# Patient Record
Sex: Male | Born: 1965 | Race: Black or African American | Hispanic: No | Marital: Single | Smoking: Never smoker
Health system: Southern US, Community
[De-identification: ages and names within clinical notes are randomized; demographics above are authoritative.]

## PROBLEM LIST (undated history)

## (undated) DIAGNOSIS — J449 Chronic obstructive pulmonary disease, unspecified: Secondary | ICD-10-CM

## (undated) DIAGNOSIS — I879 Disorder of vein, unspecified: Secondary | ICD-10-CM

## (undated) HISTORY — PX: KNEE ARTHROSCOPY: SUR90

---

## 2000-02-27 ENCOUNTER — Encounter: Admission: RE | Admit: 2000-02-27 | Discharge: 2000-02-27 | Payer: Self-pay | Admitting: Internal Medicine

## 2000-02-27 ENCOUNTER — Encounter: Payer: Self-pay | Admitting: Internal Medicine

## 2002-12-10 ENCOUNTER — Emergency Department (HOSPITAL_COMMUNITY): Admission: EM | Admit: 2002-12-10 | Discharge: 2002-12-10 | Payer: Self-pay | Admitting: Emergency Medicine

## 2002-12-10 ENCOUNTER — Encounter: Payer: Self-pay | Admitting: Emergency Medicine

## 2003-09-25 ENCOUNTER — Encounter: Admission: RE | Admit: 2003-09-25 | Discharge: 2003-09-25 | Payer: Self-pay | Admitting: Neurosurgery

## 2003-10-10 ENCOUNTER — Encounter: Admission: RE | Admit: 2003-10-10 | Discharge: 2003-10-10 | Payer: Self-pay | Admitting: Neurosurgery

## 2003-10-25 ENCOUNTER — Encounter: Admission: RE | Admit: 2003-10-25 | Discharge: 2003-10-25 | Payer: Self-pay | Admitting: Neurosurgery

## 2009-10-19 DIAGNOSIS — I879 Disorder of vein, unspecified: Secondary | ICD-10-CM

## 2009-10-19 HISTORY — DX: Disorder of vein, unspecified: I87.9

## 2010-08-22 ENCOUNTER — Emergency Department (HOSPITAL_COMMUNITY): Admission: EM | Admit: 2010-08-22 | Discharge: 2010-08-22 | Payer: Self-pay | Admitting: Emergency Medicine

## 2010-12-30 LAB — POCT I-STAT, CHEM 8
Calcium, Ion: 1.18 mmol/L (ref 1.12–1.32)
Chloride: 100 mEq/L (ref 96–112)
Glucose, Bld: 86 mg/dL (ref 70–99)
HCT: 49 % (ref 39.0–52.0)
Hemoglobin: 16.7 g/dL (ref 13.0–17.0)

## 2010-12-30 LAB — CBC
MCH: 29.4 pg (ref 26.0–34.0)
MCHC: 33.7 g/dL (ref 30.0–36.0)
RDW: 13.8 % (ref 11.5–15.5)

## 2010-12-30 LAB — DIFFERENTIAL
Basophils Absolute: 0 10*3/uL (ref 0.0–0.1)
Basophils Relative: 1 % (ref 0–1)
Eosinophils Absolute: 0.1 10*3/uL (ref 0.0–0.7)
Monocytes Absolute: 0.8 10*3/uL (ref 0.1–1.0)
Neutro Abs: 5.6 10*3/uL (ref 1.7–7.7)
Neutrophils Relative %: 68 % (ref 43–77)

## 2012-05-27 ENCOUNTER — Ambulatory Visit: Payer: Self-pay

## 2012-05-27 ENCOUNTER — Other Ambulatory Visit: Payer: Self-pay | Admitting: Occupational Medicine

## 2012-05-27 DIAGNOSIS — Z021 Encounter for pre-employment examination: Secondary | ICD-10-CM

## 2014-03-19 ENCOUNTER — Observation Stay (HOSPITAL_COMMUNITY)
Admission: AD | Admit: 2014-03-19 | Discharge: 2014-03-20 | Disposition: A | Discharge status: Home / Self Care | Payer: BC Managed Care – PPO | Source: Ambulatory Visit | Attending: Surgery | Admitting: Surgery

## 2014-03-19 ENCOUNTER — Encounter (HOSPITAL_COMMUNITY): Payer: Self-pay | Admitting: *Deleted

## 2014-03-19 ENCOUNTER — Encounter (HOSPITAL_COMMUNITY): Payer: BC Managed Care – PPO | Admitting: Certified Registered Nurse Anesthetist

## 2014-03-19 ENCOUNTER — Ambulatory Visit (INDEPENDENT_AMBULATORY_CARE_PROVIDER_SITE_OTHER): Payer: BC Managed Care – PPO | Admitting: General Surgery

## 2014-03-19 ENCOUNTER — Ambulatory Visit: Admission: AD | Admit: 2014-03-19 | Payer: BC Managed Care – PPO | Source: Ambulatory Visit | Admitting: General Surgery

## 2014-03-19 ENCOUNTER — Encounter (INDEPENDENT_AMBULATORY_CARE_PROVIDER_SITE_OTHER): Payer: Self-pay | Admitting: General Surgery

## 2014-03-19 ENCOUNTER — Encounter (HOSPITAL_COMMUNITY): Admission: AD | Disposition: A | Discharge status: Home / Self Care | Payer: Self-pay | Source: Ambulatory Visit

## 2014-03-19 ENCOUNTER — Observation Stay (HOSPITAL_COMMUNITY): Payer: BC Managed Care – PPO | Admitting: Certified Registered Nurse Anesthetist

## 2014-03-19 VITALS — BP 122/76 | HR 80 | Temp 97.2°F | Ht 71.0 in | Wt 216.0 lb

## 2014-03-19 DIAGNOSIS — IMO0002 Reserved for concepts with insufficient information to code with codable children: Secondary | ICD-10-CM

## 2014-03-19 DIAGNOSIS — L02419 Cutaneous abscess of limb, unspecified: Secondary | ICD-10-CM | POA: Insufficient documentation

## 2014-03-19 DIAGNOSIS — Z22322 Carrier or suspected carrier of Methicillin resistant Staphylococcus aureus: Secondary | ICD-10-CM | POA: Insufficient documentation

## 2014-03-19 HISTORY — PX: INCISION AND DRAINAGE ABSCESS: SHX5864

## 2014-03-19 HISTORY — DX: Disorder of vein, unspecified: I87.9

## 2014-03-19 LAB — BASIC METABOLIC PANEL
BUN: 10 mg/dL (ref 6–23)
CALCIUM: 9.2 mg/dL (ref 8.4–10.5)
CO2: 27 mEq/L (ref 19–32)
CREATININE: 0.85 mg/dL (ref 0.50–1.35)
Chloride: 101 mEq/L (ref 96–112)
GFR calc Af Amer: >90 mL/min (ref >90)
Glucose, Bld: 82 mg/dL (ref 70–99)
Potassium: 3.7 mEq/L (ref 3.7–5.3)
Sodium: 139 mEq/L (ref 137–147)

## 2014-03-19 LAB — CBC
HCT: 38.6 % — ABNORMAL LOW (ref 39.0–52.0)
Hemoglobin: 12.7 g/dL — ABNORMAL LOW (ref 13.0–17.0)
MCH: 28 pg (ref 26.0–34.0)
MCHC: 32.9 g/dL (ref 30.0–36.0)
MCV: 85 fL (ref 78.0–100.0)
Platelets: 258 10*3/uL (ref 150–400)
RBC: 4.54 MIL/uL (ref 4.22–5.81)
RDW: 13.2 % (ref 11.5–15.5)
WBC: 11.4 10*3/uL — ABNORMAL HIGH (ref 4.0–10.5)

## 2014-03-19 LAB — SURGICAL PCR SCREEN
MRSA, PCR: POSITIVE — AB
Staphylococcus aureus: POSITIVE — AB

## 2014-03-19 SURGERY — INCISION AND DRAINAGE, ABSCESS
Anesthesia: General | Site: Axilla | Laterality: Left

## 2014-03-19 MED ORDER — LACTATED RINGERS IV SOLN
INTRAVENOUS | Status: DC | PRN
  Administered 2014-03-19: 21:00:00 via INTRAVENOUS

## 2014-03-19 MED ORDER — FENTANYL CITRATE 0.05 MG/ML IJ SOLN
25.0000 ug | INTRAMUSCULAR | Status: DC | PRN
  Administered 2014-03-19: 50 ug via INTRAVENOUS

## 2014-03-19 MED ORDER — ACETAMINOPHEN 325 MG PO TABS: 650.0000 mg | ORAL_TABLET | Freq: Four times a day (QID) | ORAL | Status: DC | PRN

## 2014-03-19 MED ORDER — MORPHINE SULFATE 2 MG/ML IJ SOLN
2.0000 mg | INTRAMUSCULAR | Status: DC | PRN
  Administered 2014-03-19 – 2014-03-20 (×4): 2 mg via INTRAVENOUS
  Filled 2014-03-19 (×4): qty 1

## 2014-03-19 MED ORDER — ACETAMINOPHEN 10 MG/ML IV SOLN
1000.0000 mg | Freq: Once | INTRAVENOUS | Status: DC
  Filled 2014-03-19: qty 100

## 2014-03-19 MED ORDER — BUPIVACAINE-EPINEPHRINE 0.25% -1:200000 IJ SOLN
INTRAMUSCULAR | Status: DC | PRN
  Administered 2014-03-19: 10 mL

## 2014-03-19 MED ORDER — LACTATED RINGERS IV SOLN: INTRAVENOUS | Status: DC

## 2014-03-19 MED ORDER — FENTANYL CITRATE 0.05 MG/ML IJ SOLN
INTRAMUSCULAR | Status: AC
  Filled 2014-03-19: qty 5

## 2014-03-19 MED ORDER — ONDANSETRON HCL 4 MG/2ML IJ SOLN
INTRAMUSCULAR | Status: DC | PRN
  Administered 2014-03-19: 4 mg via INTRAVENOUS

## 2014-03-19 MED ORDER — BUPIVACAINE-EPINEPHRINE (PF) 0.25% -1:200000 IJ SOLN
INTRAMUSCULAR | Status: AC
  Filled 2014-03-19: qty 30

## 2014-03-19 MED ORDER — PROPOFOL 10 MG/ML IV BOLUS
INTRAVENOUS | Status: DC | PRN
  Administered 2014-03-19: 200 mg via INTRAVENOUS

## 2014-03-19 MED ORDER — 0.9 % SODIUM CHLORIDE (POUR BTL) OPTIME
TOPICAL | Status: DC | PRN
  Administered 2014-03-19: 1000 mL

## 2014-03-19 MED ORDER — MIDAZOLAM HCL 2 MG/2ML IJ SOLN
INTRAMUSCULAR | Status: AC
  Filled 2014-03-19: qty 2

## 2014-03-19 MED ORDER — VANCOMYCIN HCL 1000 MG IV SOLR
1000.0000 mg | INTRAVENOUS | Status: DC | PRN
  Administered 2014-03-19: 1000 mg via INTRAVENOUS

## 2014-03-19 MED ORDER — ONDANSETRON HCL 4 MG/2ML IJ SOLN: 4.0000 mg | Freq: Four times a day (QID) | INTRAMUSCULAR | Status: DC | PRN

## 2014-03-19 MED ORDER — LIDOCAINE HCL (CARDIAC) 20 MG/ML IV SOLN
INTRAVENOUS | Status: DC | PRN
  Administered 2014-03-19: 75 mg via INTRAVENOUS

## 2014-03-19 MED ORDER — SODIUM CHLORIDE 0.9 % IV SOLN
INTRAVENOUS | Status: DC
  Administered 2014-03-19 – 2014-03-20 (×2): via INTRAVENOUS

## 2014-03-19 MED ORDER — MIDAZOLAM HCL 5 MG/5ML IJ SOLN
INTRAMUSCULAR | Status: DC | PRN
  Administered 2014-03-19: 1 mg via INTRAVENOUS
  Administered 2014-03-19 (×2): .5 mg via INTRAVENOUS

## 2014-03-19 MED ORDER — PROPOFOL 10 MG/ML IV BOLUS
INTRAVENOUS | Status: AC
  Filled 2014-03-19: qty 20

## 2014-03-19 MED ORDER — ACETAMINOPHEN 650 MG RE SUPP: 650.0000 mg | Freq: Four times a day (QID) | RECTAL | Status: DC | PRN

## 2014-03-19 MED ORDER — METOCLOPRAMIDE HCL 5 MG/ML IJ SOLN
INTRAMUSCULAR | Status: DC | PRN
  Administered 2014-03-19: 5 mg via INTRAVENOUS

## 2014-03-19 MED ORDER — ACETAMINOPHEN 10 MG/ML IV SOLN
INTRAVENOUS | Status: DC | PRN
  Administered 2014-03-19: 1000 mg via INTRAVENOUS

## 2014-03-19 MED ORDER — SODIUM CHLORIDE 0.9 % IV SOLN
3.0000 g | Freq: Four times a day (QID) | INTRAVENOUS | Status: DC
  Administered 2014-03-19 – 2014-03-20 (×3): 3 g via INTRAVENOUS
  Filled 2014-03-19 (×6): qty 3

## 2014-03-19 MED ORDER — FENTANYL CITRATE 0.05 MG/ML IJ SOLN
INTRAMUSCULAR | Status: AC
  Filled 2014-03-19: qty 2

## 2014-03-19 MED ORDER — FENTANYL CITRATE 0.05 MG/ML IJ SOLN
INTRAMUSCULAR | Status: DC | PRN
  Administered 2014-03-19 (×5): 50 ug via INTRAVENOUS

## 2014-03-19 SURGICAL SUPPLY — 29 items
BLADE SURG 15 STRL LF DISP TIS (BLADE) ×1 IMPLANT
BLADE SURG 15 STRL SS (BLADE) ×1
BNDG GAUZE ELAST 4 BULKY (GAUZE/BANDAGES/DRESSINGS) IMPLANT
CANISTER SUCTION 2500CC (MISCELLANEOUS) IMPLANT
COVER SURGICAL LIGHT HANDLE (MISCELLANEOUS) IMPLANT
DECANTER SPIKE VIAL GLASS SM (MISCELLANEOUS) IMPLANT
DRAPE LAPAROSCOPIC ABDOMINAL (DRAPES) IMPLANT
DRSG PAD ABDOMINAL 8X10 ST (GAUZE/BANDAGES/DRESSINGS) IMPLANT
ELECT REM PT RETURN 9FT ADLT (ELECTROSURGICAL) ×2
ELECTRODE REM PT RTRN 9FT ADLT (ELECTROSURGICAL) ×1 IMPLANT
GLOVE BIO SURGEON STRL SZ7.5 (GLOVE) ×2 IMPLANT
GOWN STRL REUS W/TWL LRG LVL3 (GOWN DISPOSABLE) ×4 IMPLANT
KIT BASIN OR (CUSTOM PROCEDURE TRAY) ×2 IMPLANT
NEEDLE HYPO 25X1 1.5 SAFETY (NEEDLE) ×2 IMPLANT
NS IRRIG 1000ML POUR BTL (IV SOLUTION) ×2 IMPLANT
PENCIL BUTTON HOLSTER BLD 10FT (ELECTRODE) ×2 IMPLANT
SPONGE GAUZE 4X4 12PLY (GAUZE/BANDAGES/DRESSINGS) ×2 IMPLANT
SPONGE LAP 18X18 X RAY DECT (DISPOSABLE) ×2 IMPLANT
SUT MNCRL AB 4-0 PS2 18 (SUTURE) IMPLANT
SUT VIC AB 3-0 SH 27 (SUTURE)
SUT VIC AB 3-0 SH 27XBRD (SUTURE) IMPLANT
SWAB COLLECTION DEVICE MRSA (MISCELLANEOUS) ×2 IMPLANT
SYR BULB 3OZ (MISCELLANEOUS) IMPLANT
SYR CONTROL 10ML LL (SYRINGE) ×2 IMPLANT
TAPE CLOTH SURG 4X10 WHT LF (GAUZE/BANDAGES/DRESSINGS) ×2 IMPLANT
TOWEL OR 17X26 10 PK STRL BLUE (TOWEL DISPOSABLE) ×2 IMPLANT
TUBE ANAEROBIC SPECIMEN COL (MISCELLANEOUS) ×2 IMPLANT
WATER STERILE IRR 1000ML POUR (IV SOLUTION) IMPLANT
YANKAUER SUCT BULB TIP NO VENT (SUCTIONS) ×2 IMPLANT

## 2014-03-19 NOTE — H&P (View-Only) (Signed)
Patient ID: Curtis Greene, male   DOB: January 28, 1966, 48 y.o.   MRN: [id number]  Chief Complaint  Patient presents with  . Other    HPI Curtis Greene is a 48 y.o. male.  Referred by Dr Felipa Eth HPI This is a 48 year old male who is otherwise healthy who presents with several weeks of an enlarging left axillary abscess. This area has become increasingly tender. He was seen by his primary care physician on Friday and was started on some antibiotics and pain control. This has worsened over the weekend and is now draining. He notes it he has had fevers at home as well. He comes in today to have this evaluated.  History reviewed. No pertinent past medical history.  History reviewed. No pertinent past surgical history.  Family History  Problem Relation Age of Onset  . Cancer Father     lung    Social History History  Substance Use Topics  . Smoking status: Never Smoker   . Smokeless tobacco: Not on file  . Alcohol Use: Yes    No Known Allergies  No current outpatient prescriptions on file.   No current facility-administered medications for this visit.  He is currently taking abx of which he does not know name of  Review of Systems Review of Systems  Constitutional: Positive for fever. Negative for chills and unexpected weight change.  HENT: Negative for congestion, hearing loss, sore throat, trouble swallowing and voice change.   Eyes: Negative for visual disturbance.  Respiratory: Negative for cough and wheezing.   Cardiovascular: Negative for chest pain, palpitations and leg swelling.  Gastrointestinal: Negative for nausea, vomiting, abdominal pain, diarrhea, constipation, blood in stool, abdominal distention, anal bleeding and rectal pain.  Genitourinary: Negative for hematuria and difficulty urinating.  Musculoskeletal: Negative for arthralgias.  Skin: Negative for rash and wound.  Neurological: Negative for seizures, syncope, weakness and headaches.  Hematological: Negative for  adenopathy. Does not bruise/bleed easily.  Psychiatric/Behavioral: Negative for confusion.    Blood pressure 122/76, pulse 80, temperature 97.2 F (36.2 C), height 5\' 11"  (1.803 m), weight 216 lb (97.977 kg).  Physical Exam Physical Exam  Vitals reviewed. Constitutional: He appears well-developed and well-nourished.  Cardiovascular: Normal rate, regular rhythm and normal heart sounds.   Pulmonary/Chest: Effort normal and breath sounds normal. He has no wheezes. He has no rales.       Assessment    Left axillary abscess     Plan    This has been present foreveral weeks. I think it is a little bit too large to take care of in the office. I'm going to discuss with partners and have him admitted to the hospital to have this drained.        Emelia Loron 03/19/2014, 3:10 PM

## 2014-03-19 NOTE — Interval H&P Note (Signed)
History and Physical Interval Note:  03/19/2014 10:09 PM  Curtis Greene  has presented today for surgery, with the diagnosis of LEFT AXILLARY ABSCESS  The various methods of treatment have been discussed with the patient and family. After consideration of risks, benefits and other options for treatment, the patient has consented to  Procedure(s): INCISION AND DRAINAGE ABSCESS (Left) as a surgical intervention .  The patient's history has been reviewed, patient examined, no change in status, stable for surgery.  I have reviewed the patient's chart and labs.  Questions were answered to the patient's satisfaction.     Caleen Essex III

## 2014-03-19 NOTE — Transfer of Care (Signed)
Immediate Anesthesia Transfer of Care Note  Patient: Curtis Greene  Procedure(s) Performed: Procedure(s): INCISION AND DRAINAGE ABSCESS (Left)  Patient Location: PACU  Anesthesia Type:General  Level of Consciousness: awake, oriented, patient cooperative, lethargic and responds to stimulation  Airway & Oxygen Therapy: Patient Spontanous Breathing and Patient connected to face mask oxygen  Post-op Assessment: Report given to PACU RN, Post -op Vital signs reviewed and stable and Patient moving all extremities  Post vital signs: Reviewed and stable  Complications: No apparent anesthesia complications

## 2014-03-19 NOTE — Anesthesia Preprocedure Evaluation (Signed)
Anesthesia Evaluation  Patient identified by MRN, date of birth, ID band Patient awake    Reviewed: Allergy & Precautions, H&P , NPO status , Patient's Chart, lab work & pertinent test results  Airway Mallampati: II TM Distance: >3 FB Neck ROM: full    Dental no notable dental hx.    Pulmonary neg pulmonary ROS,  breath sounds clear to auscultation  Pulmonary exam normal       Cardiovascular Exercise Tolerance: Good negative cardio ROS  Rhythm:regular Rate:Normal     Neuro/Psych negative neurological ROS  negative psych ROS   GI/Hepatic negative GI ROS, Neg liver ROS,   Endo/Other  negative endocrine ROS  Renal/GU negative Renal ROS  negative genitourinary   Musculoskeletal   Abdominal   Peds  Hematology negative hematology ROS (+)   Anesthesia Other Findings   Reproductive/Obstetrics negative OB ROS                           Anesthesia Physical Anesthesia Plan  ASA: I  Anesthesia Plan: General   Post-op Pain Management:    Induction: Intravenous  Airway Management Planned: LMA  Additional Equipment:   Intra-op Plan:   Post-operative Plan:   Informed Consent: I have reviewed the patients History and Physical, chart, labs and discussed the procedure including the risks, benefits and alternatives for the proposed anesthesia with the patient or authorized representative who has indicated his/her understanding and acceptance.   Dental Advisory Given  Plan Discussed with: CRNA and Surgeon  Anesthesia Plan Comments:         Anesthesia Quick Evaluation

## 2014-03-19 NOTE — Anesthesia Postprocedure Evaluation (Signed)
  Anesthesia Post-op Note  Patient: Curtis Greene  Procedure(s) Performed: Procedure(s) (LRB): INCISION AND DRAINAGE ABSCESS (Left)  Patient Location: PACU  Anesthesia Type: General  Level of Consciousness: awake and alert   Airway and Oxygen Therapy: Patient Spontanous Breathing  Post-op Pain: mild  Post-op Assessment: Post-op Vital signs reviewed, Patient's Cardiovascular Status Stable, Respiratory Function Stable, Patent Airway and No signs of Nausea or vomiting  Last Vitals:  Filed Vitals:   03/19/14 2310  BP: 158/87  Pulse: 87  Temp:   Resp: 17    Post-op Vital Signs: stable   Complications: No apparent anesthesia complications

## 2014-03-19 NOTE — Progress Notes (Signed)
Patient ID: Curtis Greene, male   DOB: 08/14/1966, 47 y.o.   MRN: [id number]  Chief Complaint  Patient presents with  . Other    HPI Curtis Greene is a 47 y.o. male.  Referred by Dr Avva HPI This is a 47-year-old male who is otherwise healthy who presents with several weeks of an enlarging left axillary abscess. This area has become increasingly tender. He was seen by his primary care physician on Friday and was started on some antibiotics and pain control. This has worsened over the weekend and is now draining. He notes it he has had fevers at home as well. He comes in today to have this evaluated.  History reviewed. No pertinent past medical history.  History reviewed. No pertinent past surgical history.  Family History  Problem Relation Age of Onset  . Cancer Father     lung    Social History History  Substance Use Topics  . Smoking status: Never Smoker   . Smokeless tobacco: Not on file  . Alcohol Use: Yes    No Known Allergies  No current outpatient prescriptions on file.   No current facility-administered medications for this visit.  He is currently taking abx of which he does not know name of  Review of Systems Review of Systems  Constitutional: Positive for fever. Negative for chills and unexpected weight change.  HENT: Negative for congestion, hearing loss, sore throat, trouble swallowing and voice change.   Eyes: Negative for visual disturbance.  Respiratory: Negative for cough and wheezing.   Cardiovascular: Negative for chest pain, palpitations and leg swelling.  Gastrointestinal: Negative for nausea, vomiting, abdominal pain, diarrhea, constipation, blood in stool, abdominal distention, anal bleeding and rectal pain.  Genitourinary: Negative for hematuria and difficulty urinating.  Musculoskeletal: Negative for arthralgias.  Skin: Negative for rash and wound.  Neurological: Negative for seizures, syncope, weakness and headaches.  Hematological: Negative for  adenopathy. Does not bruise/bleed easily.  Psychiatric/Behavioral: Negative for confusion.    Blood pressure 122/76, pulse 80, temperature 97.2 F (36.2 C), height 5' 11" (1.803 m), weight 216 lb (97.977 kg).  Physical Exam Physical Exam  Vitals reviewed. Constitutional: He appears well-developed and well-nourished.  Cardiovascular: Normal rate, regular rhythm and normal heart sounds.   Pulmonary/Chest: Effort normal and breath sounds normal. He has no wheezes. He has no rales.       Assessment    Left axillary abscess     Plan    This has been present foreveral weeks. I think it is a little bit too large to take care of in the office. I'm going to discuss with partners and have him admitted to the hospital to have this drained.        Savon Bordonaro 03/19/2014, 3:10 PM    

## 2014-03-19 NOTE — Op Note (Signed)
03/19/2014  10:51 PM  PATIENT:  Curtis Greene  48 y.o. male  PRE-OPERATIVE DIAGNOSIS:  LEFT AXILLARY ABSCESS  POST-OPERATIVE DIAGNOSIS:  Left Axillary Abscess  PROCEDURE:  Procedure(s): INCISION AND DRAINAGE ABSCESS (Left)  SURGEON:  Surgeon(s) and Role:    * Robyne Askew, MD - Primary  PHYSICIAN ASSISTANT:   ASSISTANTS: none   ANESTHESIA:   general  EBL:     BLOOD ADMINISTERED:none  DRAINS: none   LOCAL MEDICATIONS USED:  MARCAINE     SPECIMEN:  No Specimen  DISPOSITION OF SPECIMEN:  N/A  COUNTS:  YES  TOURNIQUET:  * No tourniquets in log *  DICTATION: .Dragon Dictation After informed consent was obtained the patient brought to the operating room placed in the supine position on the operating table. After adequate induction of general anesthesia the patient's left axilla was prepped with Betadine and draped in usual sterile manner. The patient had a large abscess in the left axilla. This abscess was opened bluntly with a hemostat. Cultures were taken. A large amount of purulent material was evacuated. The rest of the wound was opened sharply with the electrocautery. Hemostasis was then achieved using the Bovie electrocautery. All loculations were broken up by blunt finger dissection. Once the abscess appeared to be completely drained then the cavity was irrigated with saline. The wound was infiltrated with quarter percent Marcaine with epinephrine. Next the wound was packed open with 4 x 4's moistened with Marcaine. Sterile dressings were then applied. The patient tolerated the procedure well. At the end of the case all needle sponge and instrument counts were correct. The patient was then awakened and taken to recovery in stable condition.  PLAN OF CARE: Admit to inpatient   PATIENT DISPOSITION:  PACU - hemodynamically stable.   Delay start of Pharmacological VTE agent (>24hrs) due to surgical blood loss or risk of bleeding: no

## 2014-03-20 ENCOUNTER — Encounter (HOSPITAL_COMMUNITY): Payer: Self-pay | Admitting: General Surgery

## 2014-03-20 MED ORDER — HYDROCODONE-ACETAMINOPHEN 5-325 MG PO TABS: 1.0000 | ORAL_TABLET | Freq: Four times a day (QID) | ORAL | Status: DC | PRN

## 2014-03-20 MED ORDER — DOXYCYCLINE HYCLATE 100 MG PO TABS: 100.0000 mg | ORAL_TABLET | Freq: Two times a day (BID) | ORAL | Status: DC

## 2014-03-20 MED ORDER — VANCOMYCIN HCL IN DEXTROSE 1-5 GM/200ML-% IV SOLN
1000.0000 mg | Freq: Three times a day (TID) | INTRAVENOUS | Status: DC
  Administered 2014-03-20: 1000 mg via INTRAVENOUS
  Filled 2014-03-20 (×3): qty 200

## 2014-03-20 NOTE — Discharge Instructions (Signed)
CENTRAL  SURGERY - DISCHARGE INSTRUCTIONS TO PATIENT  Return to work on:  04/03/2014  Activity:  Driving - May drive in 2 or 3 days, if doing well.   Lifting - No limit  Wound Care:   Change dressing 2 or 3 times a day.  Get in shower and wash wound with each dressing change.  Pack with saline gauze.  Diet:  As tolerated.  Follow up appointment:  Call Dr. Billey Chang office [facility]) at [phone] for an appointment in 6 - 10 days.  Medications and dosages:  Resume your home medications.  You have a prescription for:  Vicodin and Doxycycline  Call Dr. Carolynne Edouard or his office  [phone]) if you have:  Temperature greater than 100.4,  Severe uncontrolled pain,  Redness, tenderness, or signs of infection (pain, swelling, redness, odor or green/yellow discharge around the site),  Any other questions or concerns you may have after discharge.  In an emergency, call 911 or go to an Emergency Department at a nearby hospital.

## 2014-03-20 NOTE — Progress Notes (Signed)
ANTIBIOTIC CONSULT NOTE - INITIAL  Pharmacy Consult for vancomcyin Indication: wound infection (left axillary abscess)  No Known Allergies  Patient Measurements: Height: 5\' 11"  (180.3 cm) Weight: 216 lb (97.977 kg) IBW/kg (Calculated) : 75.3 Adjusted Body Weight:  Vital Signs: Temp: 97.5 F (36.4 C) (06/02 0514) Temp src: Axillary (06/02 0514) BP: 94/60 mmHg (06/02 0514) Pulse Rate: 66 (06/02 0514) Intake/Output from previous day: 06/01 0701 - 06/02 0700 In: 2325 [I.V.:2125; IV Piggyback:200] Out: 600 [Urine:600] Intake/Output from this shift:    Labs:  Recent Labs  03/19/14 1646  WBC 11.4*  HGB 12.7*  PLT 258  CREATININE 0.85   Estimated Creatinine Clearance: 128.3 ml/min (by C-G formula based on Cr of 0.85). No results found for this basename: VANCOTROUGH, Leodis Binet, VANCORANDOM, GENTTROUGH, GENTPEAK, GENTRANDOM, TOBRATROUGH, TOBRAPEAK, TOBRARND, AMIKACINPEAK, AMIKACINTROU, AMIKACIN,  in the last 72 hours   Microbiology: Recent Results (from the past 720 hour(s))  SURGICAL PCR SCREEN     Status: Abnormal   Collection Time    03/19/14  5:17 PM      Result Value Ref Range Status   MRSA, PCR POSITIVE (*) NEGATIVE Final   Comment: RESULT CALLED TO, READ BACK BY AND VERIFIED WITH:     J GOOD RN 2029 03/19/14 A NAVARRO   Staphylococcus aureus POSITIVE (*) NEGATIVE Final   Comment:            The Xpert SA Assay (FDA     approved for NASAL specimens     in patients over 65 years of age),     is one component of     a comprehensive surveillance     program.  Test performance has     been validated by [employer] for patients greater     than or equal to 48 year old.     It is not intended     to diagnose infection nor to     guide or monitor treatment.  CULTURE, ROUTINE-ABSCESS     Status: None   Collection Time    03/19/14 10:40 PM      Result Value Ref Range Status   Specimen Description ABSCESS LEFT AXILLA   Final   Special Requests PATIENT ON  FOLLOWING VANCOMYCIN   Final   Gram Stain     Final   Value: ABUNDANT WBC PRESENT,BOTH PMN AND MONONUCLEAR     NO SQUAMOUS EPITHELIAL CELLS SEEN     MODERATE GRAM POSITIVE COCCI IN PAIRS     Performed at [facility]   Culture PENDING   Incomplete   Report Status PENDING   Incomplete  ANAEROBIC CULTURE     Status: None   Collection Time    03/19/14 10:40 PM      Result Value Ref Range Status   Specimen Description ABSCESS LEFT AXILLA   Final   Special Requests PATIENT ON FOLLOWING VANCOMYCIN   Final   Gram Stain     Final   Value: AQB WBC PRESENT,BOTH PMN AND MONONUCLEAR     MODERATE GRAM POSITIVE COCCI IN PAIRS     Performed at [facility]   Culture PENDING   Incomplete   Report Status PENDING   Incomplete    Medical History: Past Medical History  Diagnosis Date  . 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  Vancomycin given pre-op, orders for  pharmacy to dose.  Also on ampicillin/sulbactam.  6/1 >> vancomycin  >> 6/2 >> amp/sulbactam >>    Tmax: afeb WBCs: slightly elevated Renal: CrCl > 100 ml/min (C-G/normalized)   6/1: L axilla abscess = GS = mod GPC pairs  Goal of Therapy:  Vancomycin trough level 10-15 mcg/ml  Plan:   Vancomycin 1gm IV q8h  Check vancomycin trough if remains on vancomycin > 3 days  Currently amp/sulbactam dosing appropriate  Juliette Alcideustin Zeigler, PharmD, BCPS.   Pager: [phone]  03/20/2014,7:18 AM

## 2014-03-20 NOTE — Discharge Summary (Signed)
Physician Discharge Summary  Patient ID:  Curtis Greene  MRN: [id number]  DOB/AGE: 1966/01/02 48 y.o.  Admit date: 03/19/2014 Discharge date: 03/20/2014  Discharge Diagnoses:   1.  Left axillary abscess 2.  MRSA nasal swab positive  Active Problems:   Axillary abscess  Operation: Procedure(s):  INCISION AND DRAINAGE ABSCESS of left axilla on 03/19/2014 - P. Carolynne Edouardoth  Discharged Condition: good  Hospital Course: Curtis Greene is an 48 y.o. male whose primary care physician is Hoyle SauerAVVA,RAVISANKAR R, MD and who was admitted 03/19/2014 with a chief complaint of left axillary abscess.   He was brought to the operating room on 03/19/2014 and underwent I&D of left axillary abscess.  He is now one day post op.  His wife is in the room.   I changed the wound at the bedside and showed the wife.  The wound looks good.  He is ready to go home. 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  He will be out of work until his wound check in the office.  The discharge instructions were reviewed with the patient.  Consults: None  Significant Diagnostic Studies: Results for orders placed during the hospital encounter of 03/19/14  SURGICAL PCR SCREEN      Result Value Ref Range   MRSA, PCR POSITIVE (*) NEGATIVE   Staphylococcus aureus POSITIVE (*) NEGATIVE  CULTURE, ROUTINE-ABSCESS      Result Value Ref Range   Specimen Description ABSCESS LEFT AXILLA     Special Requests PATIENT ON FOLLOWING VANCOMYCIN     Gram Stain       Value: ABUNDANT WBC PRESENT,BOTH PMN AND MONONUCLEAR     NO SQUAMOUS EPITHELIAL CELLS SEEN     MODERATE GRAM POSITIVE COCCI IN PAIRS     Performed at [facility]   Culture PENDING     Report Status PENDING    ANAEROBIC CULTURE      Result Value Ref Range   Specimen Description ABSCESS LEFT AXILLA     Special Requests PATIENT ON FOLLOWING VANCOMYCIN     Gram Stain       Value: AQB WBC PRESENT,BOTH PMN AND MONONUCLEAR     MODERATE GRAM POSITIVE COCCI IN PAIRS     Performed at [facility]   Culture PENDING     Report Status PENDING    BASIC METABOLIC PANEL      Result Value Ref Range   Sodium 139  137 - 147 mEq/L   Potassium 3.7  3.7 - 5.3 mEq/L   Chloride 101  96 - 112 mEq/L   CO2 27  19 - 32 mEq/L   Glucose, Bld 82  70 - 99 mg/dL   BUN 10  6 - 23 mg/dL   Creatinine, Ser 8.460.85  0.50 - 1.35 mg/dL   Calcium 9.2  8.4 - 96.210.5 mg/dL   GFR calc non Af Amer >90  >90 mL/min   GFR calc Af Amer >90  >90 mL/min  CBC      Result Value Ref Range   WBC 11.4 (*) 4.0 - 10.5 K/uL   RBC 4.54  4.22 - 5.81 MIL/uL   Hemoglobin 12.7 (*) 13.0 - 17.0 g/dL   HCT 95.238.6 (*) 84.139.0 - 32.452.0 %   MCV 85.0  78.0 - 100.0 fL   MCH 28.0  26.0 - 34.0 pg   MCHC 32.9  30.0 - 36.0 g/dL   RDW 40.113.2  02.711.5 - 25.315.5 %   Platelets 258  150 - 400  K/uL    No results found.  Discharge Exam:  Filed Vitals:   03/20/14 0900  BP: 115/69  Pulse: 84  Temp: 97.8 F (36.6 C)  Resp: 16    General: WN AA M who is alert and generally healthy appearing.  Extremities:  Approx 7 cm open wound left axilla that is clean.  Discharge Medications:     Medication List         amoxicillin-clavulanate 875-125 MG per tablet  Commonly known as:  AUGMENTIN  Take 1 tablet by mouth 2 (two) times daily.     doxycycline 100 MG tablet  Commonly known as:  VIBRA-TABS  Take 1 tablet (100 mg total) by mouth 2 (two) times daily.     HYDROcodone-acetaminophen 5-325 MG per tablet  Commonly known as:  NORCO/VICODIN  Take 1-2 tablets by mouth every 6 (six) hours as needed.        Disposition: Final discharge disposition not confirmed      Discharge Instructions   Diet - low sodium heart healthy    Complete by:  As directed      Increase activity slowly    Complete by:  As directed           Return to work on: 04/03/2014   Activity: Driving - May drive in 2 or 3 days, if doing well.  Lifting - No limit   Wound Care: Change dressing 2 or 3 times a day. Get in shower and wash wound with each dressing  change. Pack with saline gauze.  Diet: As tolerated.   Follow up appointment: Call Dr. Billey Chang office [facility]) at [phone] for an appointment in 6 - 10 days.   Medications and dosages:  Resume your home medications.  You have a prescription for: Vicodin and Doxycycline   Signed: Ovidio Kin, M.D., [facility] Surgery Office:  [phone]  03/20/2014, 9:48 AM

## 2014-03-22 LAB — CULTURE, ROUTINE-ABSCESS

## 2014-03-23 ENCOUNTER — Telehealth (INDEPENDENT_AMBULATORY_CARE_PROVIDER_SITE_OTHER): Payer: Self-pay

## 2014-03-23 NOTE — Telephone Encounter (Signed)
Pt and wife walked into the office this pm to drop off disability paperwork and then wanted to ask Dr Carolynne Edouardoth a question. Toniann FailWendy at check out advised Marcelino DusterMichelle that a pt needed to see her but Marcelino DusterMichelle was going into a room with Dr Carolynne Edouardoth so I said I would try to help the pt. The wife was asking about the pt's lt axillary wound that is still having some swelling and she was not sure why there is still a swollen area at the wound. The wife thought that all of the area would be flat since he had to have the axillary abscess opened up and drained in the OR on 6/1. I asked the pt if he was running fevers and she said he was having some low grade temps. I asked if the wound was draining and the wife replied yes b/c she is the one doing the daily dressing changes on the wound. I asked if the redness was going down from Monday and the wife said yes. I asked if the pt was taking his abx's that he was sent home with the Doxycycline and she said yes. I asked if they had called the office to speak to anyone about the concerns with the wound and the wife said no she thought she would just ask when they walked in. I advised to the wife that they should always call if they have concerns not walk in to ask questions.The wife stated that she was not happy with Dr Carolynne Edouardoth and she really wanted to speak to him. The wife stated that Dr Carolynne Edouardoth never came and spoke to her once the surgery was done.I tried to explain to the wife that when the doctor is on call at the hospital the doctor is going from surgery to surgery some of the times. I explained that the doctor on call might get paged for trauma's not just surgery. The wife was unhappy that the pt had to wait in the holding room 2hrs before his surgery. I told the wife that I would have to go check with Dr Carolynne Edouardoth but I was not sure if he could help b/c he was seeing pt's still in the urgent office after 5:00pm. I waited for Dr Carolynne Edouardoth to come out of the room to explain the situation about the pt  walking in and Dr Carolynne Edouardoth said that he was not able to see the pt that if they were that concerned about the wound they would need to go to the ER since it is after 5:00pm. Dr Carolynne Edouardoth advised if there is something like another abscess with the wound there is nothing he can do now in the office comfortably for the pt. I went to the wife to explain what Dr Carolynne Edouardoth had advised and the wife immediately said she was so over this doctor that she wanted to speak to Deephavenarol. I went looking for Okey RegalCarol but it was after 5:30 now and Okey RegalCarol had left for the night. I explained to the wife once again that with it being now after 5:30 Okey RegalCarol was gone but the wife said I don't care about it being after 5:00. I then said this is why you need to call our office b/c we could try to help arrange things better than now but I apologized to the pt along with the wife. I advised the wife that if she noticed redness, fevers of 101 or higher, or foul odor that would be sure signs something is wrong with the wound.  The wife said none of those issues are here now but she is just not sure about the swelling still. The wife asked who she should call to complain about the clinical issue Okey Regal or Dr Corliss Skains. I advised her that she would need to call Okey Regal b/c she is the Production designer, theatre/television/film of [employer]. The wife took down my name. I advised the pt and wife if any of these changes should occur over the weekend for them to call our main number that there is a doctor on call that she could talk to if she thought things were not getting better.

## 2014-03-24 LAB — ANAEROBIC CULTURE

## 2014-03-26 NOTE — Telephone Encounter (Signed)
Called Curtis Greene's wife back and LMOM. I made an appt for the Curtis Greene to see Dr Ezzard Standing on 03/29/14 arrive at 8:30/8:45.

## 2014-03-26 NOTE — Telephone Encounter (Signed)
Called pt's wife after she spoke with Okey Regal to let her know that I would have to speak to Dr Dwain Sarna tomorrow in office to see where we could overbook him to see the pt this week. The pt's wife said she really would like for the pt to see Dr Ezzard Standing instead since Dr Ezzard Standing is the doctor that saw the pt in the hospital. I advised the wife that I would have to check with Dr Allene Pyo assistant and call her back. The wife is fine with this plan.

## 2014-03-26 NOTE — Telephone Encounter (Signed)
Unfortunately she showed up at 5:30 without having called first and there was no time to see her. I am sorry she feels like I made no attempt to look for her after surgery. I actually looked for her in surgical waiting as well as radiology waiting areas and I could not find her anywhere. I don't know where else I could have looked for her. I agree that if she had called earlier in the day we could have made arrangements to help her. If she would like to see someone else in our practice then I will be happy to help her with that. Carolynne Edouard

## 2014-03-29 ENCOUNTER — Ambulatory Visit (INDEPENDENT_AMBULATORY_CARE_PROVIDER_SITE_OTHER): Payer: BC Managed Care – PPO | Admitting: Surgery

## 2014-03-29 ENCOUNTER — Encounter (INDEPENDENT_AMBULATORY_CARE_PROVIDER_SITE_OTHER): Payer: Self-pay | Admitting: Surgery

## 2014-03-29 VITALS — BP 124/78 | HR 76 | Temp 98.0°F | Resp 18 | Ht 72.0 in | Wt 216.0 lb

## 2014-03-29 DIAGNOSIS — IMO0002 Reserved for concepts with insufficient information to code with codable children: Secondary | ICD-10-CM

## 2014-03-29 DIAGNOSIS — L02419 Cutaneous abscess of limb, unspecified: Secondary | ICD-10-CM

## 2014-03-29 NOTE — Progress Notes (Signed)
[facility]  Ovidio Kin, MD,  FACS [address] [place]  [address] [place], [place]    [address] Phone:  [phone] FAX:  [phone]   Re:   Curtis Greene DOB:   08-May-1966 MRN:   [id number]  ASSESSMENT AND PLAN: 1.  Left axillary abscess - MRSA  I&D by Dr. Carolynne Edouard - 03/19/2014 [facility]  This wound looks good.  I will keep out of work until 04/16/2014  I will see him back in 2 weeks.  1A. Now has second area - approx 2.0 cm in size, just above the wound  I did I&D today - clear fluid drained. 2.  MRSA nasal swab  HISTORY OF PRESENT ILLNESS: Chief Complaint  Patient presents with  . Routine Post Op    Left Curtis Greene is a 48 y.o. (DOB: [birth year])  white  male who is a patient of AVVA,RAVISANKAR R, MD and comes to me today for follow up of left axillary abscess. Wife is with him. He has done well with the dressing changes in his left axilla. He completed antibiotics on Tuesday, June 9. He has a tender area above the wound that is bothering him some.  Past Medical History  Diagnosis Date  . 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 Works on Haematologist cigarette machines at [employer]  PHYSICAL EXAM: BP 124/78  Pulse 76  Temp(Src) 98 F (36.7 C)  Resp 18  Ht 6' (1.829 m)  Wt 216 lb (97.977 kg)  BMI 29.29 kg/m2  Wound:  Left axilla is clean.  But second area above the wound needs I&D.   Left axilla - note are above the wound that needs I&D.  Procedure:  Painted the wound until betadine.  Infiltrated 8 cc of 1% xylocaine.  I did an I&D.  Wife in the room during the procedure.  DATA REVIEWED: Epic notes.  Ovidio Kin, MD,  [facility] [address] [place]  [address]   [place], [place]    [address] Phone:  [phone] FAX:  [phone]

## 2014-03-30 ENCOUNTER — Encounter (INDEPENDENT_AMBULATORY_CARE_PROVIDER_SITE_OTHER): Payer: BC Managed Care – PPO | Admitting: General Surgery

## 2014-04-13 ENCOUNTER — Encounter (INDEPENDENT_AMBULATORY_CARE_PROVIDER_SITE_OTHER): Payer: Self-pay | Admitting: Surgery

## 2014-04-13 ENCOUNTER — Ambulatory Visit (INDEPENDENT_AMBULATORY_CARE_PROVIDER_SITE_OTHER): Payer: BC Managed Care – PPO | Admitting: Surgery

## 2014-04-13 VITALS — BP 130/74 | HR 76 | Temp 98.0°F | Resp 18 | Ht 71.0 in | Wt 215.0 lb

## 2014-04-13 DIAGNOSIS — IMO0002 Reserved for concepts with insufficient information to code with codable children: Secondary | ICD-10-CM

## 2014-04-13 DIAGNOSIS — L02419 Cutaneous abscess of limb, unspecified: Secondary | ICD-10-CM

## 2014-04-13 NOTE — Progress Notes (Signed)
[facility]  [name] Newman, MD,  FACS [address]  [address] [place], [place]    [id number] Phone:  [phone] FAX:  [phone]   Re:   Steele BergJohn E Dallman DOB:   07-13-1966 MRN:   [id number]  ASSESSMENT AND PLAN: 1.  Left axillary abscess - MRSA  I&D by Dr. Carolynne Edouardoth - 03/19/2014 [facility]  This wound looks good.  I have extended him out of work until 05/04/2014  I will see him back in 2 weeks.  I gave him more Vicodin (5/325) #30.    1A. Now has second area - this area is pretty much healed. 2.   MRSA nasal swab  HISTORY OF PRESENT ILLNESS: Chief Complaint  Patient presents with  . Routine Post Op    axillia F/U   Steele BergJohn E Hudock is a 48 y.o. (DOB: 07-13-1966)  white  male who is a patient of AV[facility],RAVISANKAR R, MD and comes to me today for follow up of left axillary abscess. He comes by himself today. He feels some pulling.  He also has to lift 70 pounds at work and does not think that he can do that.  He also thinks he feels some swelling in his left epitrochlear area. Overall I think he is progressing well.  Past Medical History  Diagnosis Date  . 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  He is on short term disability.  PHYSICAL EXAM: BP 130/74  Pulse 76  Temp(Src) 98 F (36.7 C)  Resp 18  Ht 5\' 11"  (1.803 m)  Wt 215 lb (97.523 kg)  BMI 30.00 kg/m2  Wound:  Left axilla is clean.  It is continuing to look better.  He has some pulling up his left upper arm.   Left axilla - from 6/11 visit.  DATA REVIEWED: Epic notes.  [name] Newman, MD,  [facility] [address]  [address]   [place], [place]    [id number] Phone:  [phone] FAX:  [phone]

## 2014-05-03 ENCOUNTER — Encounter (INDEPENDENT_AMBULATORY_CARE_PROVIDER_SITE_OTHER): Payer: Self-pay | Admitting: Surgery

## 2014-05-03 ENCOUNTER — Ambulatory Visit (INDEPENDENT_AMBULATORY_CARE_PROVIDER_SITE_OTHER): Payer: BC Managed Care – PPO | Admitting: Surgery

## 2014-05-03 VITALS — BP 126/80 | HR 76 | Temp 98.0°F | Resp 18 | Ht 72.0 in | Wt 222.0 lb

## 2014-05-03 DIAGNOSIS — IMO0002 Reserved for concepts with insufficient information to code with codable children: Secondary | ICD-10-CM

## 2014-05-03 DIAGNOSIS — L02419 Cutaneous abscess of limb, unspecified: Secondary | ICD-10-CM

## 2014-05-03 NOTE — Progress Notes (Signed)
[facility]  [name] [name], MD,  FACS [address]  [address] [place], [place]    [id number] Phone:  [phone] FAX:  [phone]   Re:   Steele BergJohn E Greene DOB:   01-19-1966 MRN:   [id number]  ASSESSMENT AND PLAN: 1.  Left axillary abscess - MRSA  I&D by Dr. Carolynne Edouardoth - 03/19/2014 [facility] (The patient wanted follow up with me)  This wound looks good.  He is to continue 2 to 3 showers/wound cleaning per day.  He is out of work until 05/04/2014, but he is not ready to return because of weakness of the left arm.  He is just about healed.  But his left arm is weak.  So I need to see him back in 2 to 3 weeks to check him one more time before going back to work.  Normally, I would have made this his last visit.  I will see him back on 05/16/2014.  I have written him out until 05/19/2014.  2.   MRSA nasal swab 3.  Small inflamed area on inner left arm and left abdomen. <1.0 cm areas that are irritated.  HISTORY OF PRESENT ILLNESS: Chief Complaint  Patient presents with  . Routine Post Op    [name]   Curtis Greene is a 48 y.o. (DOB: 01-19-1966)  white  male who is a patient of AVVA,RAVISANKAR R, MD and comes to me today for follow up of left axillary abscess. He comes by himself today. He still feels some swelling in his left epitrochlear area. His main complaint is that his left arm is weak.  He has to lift 70 pound objects over his head.  There is not light duty at work. So I'll need to see him back in 2 to 3 weeks to check the wound and see how he is doing towards his physical activity.  Past Medical History  Diagnosis Date  . 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  He is on short term disability.  PHYSICAL EXAM: BP 126/80  Pulse 76  Temp(Src) 98 F (36.7 C)  Resp 18  Ht 6' (1.829 m)  Wt 222 lb (100.699 kg)  BMI 30.10 kg/m2  Wound:   Left axilla is clean.  It is continuing to look better.  It is almost healed.  He maybe has 2 to 3 mm of wound separation.   Left axilla - from 6/11 visit.  DATA REVIEWED: Epic notes.  [name] Terrie Haring, MD,  [facility] [address]  [address]   [place], [place]    [id number] Phone:  [phone] FAX:  [phone]

## 2014-05-16 ENCOUNTER — Encounter (INDEPENDENT_AMBULATORY_CARE_PROVIDER_SITE_OTHER): Payer: Self-pay | Admitting: Surgery

## 2014-05-16 ENCOUNTER — Ambulatory Visit (INDEPENDENT_AMBULATORY_CARE_PROVIDER_SITE_OTHER): Payer: BC Managed Care – PPO | Admitting: Surgery

## 2014-05-16 VITALS — BP 128/72 | HR 76 | Temp 98.0°F | Resp 18 | Ht 70.0 in | Wt 223.0 lb

## 2014-05-16 DIAGNOSIS — L02419 Cutaneous abscess of limb, unspecified: Secondary | ICD-10-CM

## 2014-05-16 DIAGNOSIS — IMO0002 Reserved for concepts with insufficient information to code with codable children: Secondary | ICD-10-CM

## 2014-05-16 NOTE — Progress Notes (Signed)
[facility]  Curtis Greene Keymon Mcelroy, MD,  FACS [address]  [address] [place], [place]    [id number] Phone:  [phone] FAX:  [phone]   Re:   Steele BergJohn E Quesada DOB:   25-Dec-1965 MRN:   [id number]  ASSESSMENT AND PLAN: 1.  Left axillary abscess - MRSA  I&D by Dr. Carolynne Edouardoth - 03/19/2014 [facility] (The patient wanted follow up with me)  This wound looks good.  It is just about healed.  The front 1/3 of the wound is about 3 mm apart.  He wanted to try to stay out of work until the wound was healed, which is okay.  I have written him out until 05/28/2014.  His return appointment is PRN.  2.   MRSA nasal swab 3.  Small inflamed area on inner left arm and left abdomen.   These are better. 4.  He saw Dr. Johna SheriffHoxworth in 04/10/2010 for gynecomastia.  HISTORY OF PRESENT ILLNESS: Chief Complaint  Patient presents with  . Bariatric Follow Up   Steele BergJohn E Chiappetta is a 48 y.o. (DOB: 25-Dec-1965)  white  male who is a patient of AV[facility],RAVISANKAR R, MD and comes to me today for follow up of left axillary abscess. He comes by himself today.  He is continuing to do better. His arm strength is better. The wound is not entirely healed.  We agreed to keep him out of work until 05/28/2014.  This is his last visit with me.  Past Medical History  Diagnosis Date  . 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  He is on short term disability.  PHYSICAL EXAM: BP 128/72  Pulse 76  Temp(Src) 98 F (36.7 C)  Resp 18  Ht 5\' 10"  (1.778 m)  Wt 223 lb (101.152 kg)  BMI 32.00 kg/m2  Wound:  Left axilla is just about healed.  The left epitrochlear area is better.  He has good movement of his left arm.   Left axilla - from 7/27  visit.  DATA REVIEWED: Epic notes.  Curtis Greene Conal Shetley, MD,  [facility] [address]  [address]   [place], [place]     [id number] Phone:  [phone] FAX:  [phone]

## 2014-11-23 ENCOUNTER — Encounter (HOSPITAL_COMMUNITY): Payer: Self-pay

## 2014-11-23 ENCOUNTER — Emergency Department (HOSPITAL_COMMUNITY)
Admission: EM | Admit: 2014-11-23 | Discharge: 2014-11-23 | Disposition: A | Discharge status: Home / Self Care | Payer: No Typology Code available for payment source | Attending: Emergency Medicine | Admitting: Emergency Medicine

## 2014-11-23 DIAGNOSIS — Y998 Other external cause status: Secondary | ICD-10-CM | POA: Insufficient documentation

## 2014-11-23 DIAGNOSIS — S199XXA Unspecified injury of neck, initial encounter: Secondary | ICD-10-CM | POA: Insufficient documentation

## 2014-11-23 DIAGNOSIS — Y92410 Unspecified street and highway as the place of occurrence of the external cause: Secondary | ICD-10-CM | POA: Insufficient documentation

## 2014-11-23 DIAGNOSIS — S3992XA Unspecified injury of lower back, initial encounter: Secondary | ICD-10-CM | POA: Insufficient documentation

## 2014-11-23 DIAGNOSIS — Z8679 Personal history of other diseases of the circulatory system: Secondary | ICD-10-CM | POA: Insufficient documentation

## 2014-11-23 DIAGNOSIS — Z792 Long term (current) use of antibiotics: Secondary | ICD-10-CM | POA: Diagnosis not present

## 2014-11-23 DIAGNOSIS — M549 Dorsalgia, unspecified: Secondary | ICD-10-CM

## 2014-11-23 DIAGNOSIS — Y9389 Activity, other specified: Secondary | ICD-10-CM | POA: Diagnosis not present

## 2014-11-23 MED ORDER — CYCLOBENZAPRINE HCL 10 MG PO TABS: 10.0000 mg | ORAL_TABLET | Freq: Two times a day (BID) | ORAL | Status: DC | PRN

## 2014-11-23 MED ORDER — HYDROCODONE-ACETAMINOPHEN 5-325 MG PO TABS
1.0000 | ORAL_TABLET | Freq: Four times a day (QID) | ORAL | Status: DC | PRN
Start: 2014-11-23 — End: 2018-01-11
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
## 2014-11-23 NOTE — Discharge Instructions (Signed)
Back Pain, Adult °Low back pain is very common. About 1 in 5 people have back pain. The cause of low back pain is rarely dangerous. The pain often gets better over time. About half of people with a sudden onset of back pain feel better in just 2 weeks. About 8 in 10 people feel better by 6 weeks.  °CAUSES °Some common causes of back pain include: °· Strain of the muscles or ligaments supporting the spine. °· Wear and tear (degeneration) of the spinal discs. °· Arthritis. °· Direct injury to the back. °DIAGNOSIS °Most of the time, the direct cause of low back pain is not known. However, back pain can be treated effectively even when the exact cause of the pain is unknown. Answering your caregiver's questions about your overall health and symptoms is one of the most accurate ways to make sure the cause of your pain is not dangerous. If your caregiver needs more information, he or she may order lab work or imaging tests (X-rays or MRIs). However, even if imaging tests show changes in your back, this usually does not require surgery. °HOME CARE INSTRUCTIONS °For many people, back pain returns. Since low back pain is rarely dangerous, it is often a condition that people can learn to manage on their own.  °· Remain active. It is stressful on the back to sit or stand in one place. Do not sit, drive, or stand in one place for more than 30 minutes at a time. Take short walks on level surfaces as soon as pain allows. Try to increase the length of time you walk each day. °· Do not stay in bed. Resting more than 1 or 2 days can delay your recovery. °· Do not avoid exercise or work. Your body is made to move. It is not dangerous to be active, even though your back may hurt. Your back will likely heal faster if you return to being active before your pain is gone. °· Pay attention to your body when you  bend and lift. Many people have less discomfort when lifting if they bend their knees, keep the load close to their bodies, and  avoid twisting. Often, the most comfortable positions are those that put less stress on your recovering back. °· Find a comfortable position to sleep. Use a firm mattress and lie on your side with your knees slightly bent. If you lie on your back, put a pillow under your knees. °· Only take over-the-counter or prescription medicines as directed by your caregiver. Over-the-counter medicines to reduce pain and inflammation are often the most helpful. Your caregiver may prescribe muscle relaxant drugs. These medicines help dull your pain so you can more quickly return to your normal activities and healthy exercise. °· Put ice on the injured area. °· Put ice in a plastic bag. °· Place a towel between your skin and the bag. °· Leave the ice on for 15-20 minutes, 03-04 times a day for the first 2 to 3 days. After that, ice and heat may be alternated to reduce pain and spasms. °· Ask your caregiver about trying back exercises and gentle massage. This may be of some benefit. °· Avoid feeling anxious or stressed. Stress increases muscle tension and can worsen back pain. It is important to recognize when you are anxious or stressed and learn ways to manage it. Exercise is a great option. °SEEK MEDICAL CARE IF: °· You have pain that is not relieved with rest or medicine. °· You have pain that does not improve in 1 week. °· You have new symptoms. °· You are generally not feeling well. °SEEK   IMMEDIATE MEDICAL CARE IF:  °· You have pain that radiates from your back into your legs. °· You develop new bowel or bladder control problems. °· You have unusual weakness or numbness in your arms or legs. °· You develop nausea or vomiting. °· You develop abdominal pain. °· You feel faint. °Document Released: 10/05/2005 Document Revised: 04/05/2012 Document Reviewed: 02/06/2014 °ExitCare® Patient Information ©2015 [employer]. This information is not intended to replace advice given to you by your health care provider. Make sure you  discuss any questions you have with your health care provider. ° °Motor Vehicle Collision °It is common to have multiple bruises and sore muscles after a motor vehicle collision (MVC). These tend to feel worse for the first 24 hours. You may have the most stiffness and soreness over the first several hours. You may also feel worse when you wake up the first morning after your collision. After this point, you will usually begin to improve with each day. The speed of improvement often depends on the severity of the collision, the number of injuries, and the location and nature of these injuries. °HOME CARE INSTRUCTIONS °· Put ice on the injured area. °¨ Put ice in a plastic bag. °¨ Place a towel between your skin and the bag. °¨ Leave the ice on for 15-20 minutes, 3-4 times a day, or as directed by your health care provider. °· Drink enough fluids to keep your urine clear or pale yellow. Do not drink alcohol. °· Take a warm shower or bath once or twice a day. This will increase blood flow to sore muscles. °· You may return to activities as directed by your caregiver. Be careful when lifting, as this may aggravate neck or back pain. °· Only take over-the-counter or prescription medicines for pain, discomfort, or fever as directed by your caregiver. Do not use aspirin. This may increase bruising and bleeding. °SEEK IMMEDIATE MEDICAL CARE IF: °· You have numbness, tingling, or weakness in the arms or legs. °· You develop severe headaches not relieved with medicine. °· You have severe neck pain, especially tenderness in the middle of the back of your neck. °· You have changes in bowel or bladder control. °· There is increasing pain in any area of the body. °· You have shortness of breath, light-headedness, dizziness, or fainting. °· You have chest pain. °· You feel sick to your stomach (nauseous), throw up (vomit), or sweat. °· You have increasing abdominal discomfort. °· There is blood in your urine, stool, or  vomit. °· You have pain in your shoulder (shoulder strap areas). °· You feel your symptoms are getting worse. °MAKE SURE YOU: °· Understand these instructions. °· Will watch your condition. °· Will get help right away if you are not doing well or get worse. °Document Released: 10/05/2005 Document Revised: 02/19/2014 Document Reviewed: 03/04/2011 °ExitCare® Patient Information ©2015 [employer]. This information is not intended to replace advice given to you by your health care provider. Make sure you discuss any questions you have with your health care provider. ° °
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
## 2014-11-27 ENCOUNTER — Ambulatory Visit (HOSPITAL_COMMUNITY)
Admission: RE | Admit: 2014-11-27 | Discharge: 2014-11-27 | Disposition: A | Discharge status: Home / Self Care | Payer: No Typology Code available for payment source | Source: Ambulatory Visit | Attending: Chiropractic Medicine | Admitting: Chiropractic Medicine

## 2014-11-27 ENCOUNTER — Other Ambulatory Visit (HOSPITAL_COMMUNITY): Payer: Self-pay | Admitting: Chiropractic Medicine

## 2014-11-27 DIAGNOSIS — M542 Cervicalgia: Secondary | ICD-10-CM | POA: Diagnosis present

## 2014-11-27 DIAGNOSIS — M545 Low back pain: Secondary | ICD-10-CM | POA: Insufficient documentation

## 2014-12-17 ENCOUNTER — Other Ambulatory Visit (HOSPITAL_COMMUNITY): Payer: Self-pay | Admitting: Chiropractic Medicine

## 2014-12-17 DIAGNOSIS — M545 Low back pain: Secondary | ICD-10-CM

## 2014-12-25 ENCOUNTER — Ambulatory Visit (HOSPITAL_COMMUNITY)
Admission: RE | Admit: 2014-12-25 | Discharge: 2014-12-25 | Disposition: A | Discharge status: Home / Self Care | Payer: No Typology Code available for payment source | Source: Ambulatory Visit | Attending: Chiropractic Medicine | Admitting: Chiropractic Medicine

## 2014-12-25 DIAGNOSIS — M5441 Lumbago with sciatica, right side: Secondary | ICD-10-CM | POA: Diagnosis present

## 2014-12-25 DIAGNOSIS — M47896 Other spondylosis, lumbar region: Secondary | ICD-10-CM | POA: Diagnosis not present

## 2014-12-25 DIAGNOSIS — M545 Low back pain: Secondary | ICD-10-CM

## 2014-12-25 DIAGNOSIS — M5137 Other intervertebral disc degeneration, lumbosacral region: Secondary | ICD-10-CM | POA: Diagnosis not present

## 2016-04-20 DIAGNOSIS — R350 Frequency of micturition: Secondary | ICD-10-CM | POA: Diagnosis not present

## 2016-04-20 DIAGNOSIS — E663 Overweight: Secondary | ICD-10-CM | POA: Diagnosis not present

## 2016-04-20 DIAGNOSIS — Z0000 Encounter for general adult medical examination without abnormal findings: Secondary | ICD-10-CM | POA: Diagnosis not present

## 2016-12-23 DIAGNOSIS — R5383 Other fatigue: Secondary | ICD-10-CM | POA: Diagnosis not present

## 2017-03-10 DIAGNOSIS — R350 Frequency of micturition: Secondary | ICD-10-CM | POA: Diagnosis not present

## 2017-03-10 DIAGNOSIS — Z125 Encounter for screening for malignant neoplasm of prostate: Secondary | ICD-10-CM | POA: Diagnosis not present

## 2017-04-02 DIAGNOSIS — Z0000 Encounter for general adult medical examination without abnormal findings: Secondary | ICD-10-CM | POA: Diagnosis not present

## 2017-04-02 DIAGNOSIS — L309 Dermatitis, unspecified: Secondary | ICD-10-CM | POA: Diagnosis not present

## 2017-04-02 DIAGNOSIS — Z1211 Encounter for screening for malignant neoplasm of colon: Secondary | ICD-10-CM | POA: Diagnosis not present

## 2017-04-30 DIAGNOSIS — K635 Polyp of colon: Secondary | ICD-10-CM | POA: Diagnosis not present

## 2017-04-30 DIAGNOSIS — Z1211 Encounter for screening for malignant neoplasm of colon: Secondary | ICD-10-CM | POA: Diagnosis not present

## 2017-04-30 DIAGNOSIS — D126 Benign neoplasm of colon, unspecified: Secondary | ICD-10-CM | POA: Diagnosis not present

## 2017-05-04 DIAGNOSIS — Z1211 Encounter for screening for malignant neoplasm of colon: Secondary | ICD-10-CM | POA: Diagnosis not present

## 2017-05-04 DIAGNOSIS — K635 Polyp of colon: Secondary | ICD-10-CM | POA: Diagnosis not present

## 2017-05-04 DIAGNOSIS — D126 Benign neoplasm of colon, unspecified: Secondary | ICD-10-CM | POA: Diagnosis not present

## 2017-11-10 DIAGNOSIS — J069 Acute upper respiratory infection, unspecified: Secondary | ICD-10-CM | POA: Diagnosis not present

## 2017-11-10 DIAGNOSIS — R062 Wheezing: Secondary | ICD-10-CM | POA: Diagnosis not present

## 2017-11-19 ENCOUNTER — Other Ambulatory Visit: Payer: Self-pay | Admitting: Family Medicine

## 2017-11-19 ENCOUNTER — Ambulatory Visit
Admission: RE | Admit: 2017-11-19 | Discharge: 2017-11-19 | Disposition: A | Discharge status: Home / Self Care | Payer: BLUE CROSS/BLUE SHIELD | Source: Ambulatory Visit | Attending: Family Medicine | Admitting: Family Medicine

## 2017-11-19 DIAGNOSIS — R05 Cough: Secondary | ICD-10-CM

## 2017-11-19 DIAGNOSIS — R059 Cough, unspecified: Secondary | ICD-10-CM

## 2017-11-19 DIAGNOSIS — R509 Fever, unspecified: Secondary | ICD-10-CM | POA: Diagnosis not present

## 2018-01-11 ENCOUNTER — Ambulatory Visit (INDEPENDENT_AMBULATORY_CARE_PROVIDER_SITE_OTHER): Payer: BLUE CROSS/BLUE SHIELD | Admitting: Internal Medicine

## 2018-01-11 ENCOUNTER — Encounter: Payer: Self-pay | Admitting: Internal Medicine

## 2018-01-11 VITALS — BP 120/78 | HR 88 | Ht 72.0 in | Wt 239.4 lb

## 2018-01-11 DIAGNOSIS — R0609 Other forms of dyspnea: Secondary | ICD-10-CM

## 2018-01-11 DIAGNOSIS — R053 Chronic cough: Secondary | ICD-10-CM

## 2018-01-11 DIAGNOSIS — R05 Cough: Secondary | ICD-10-CM

## 2018-01-11 DIAGNOSIS — R0600 Dyspnea, unspecified: Secondary | ICD-10-CM

## 2018-01-11 LAB — NITRIC OXIDE: NITRIC OXIDE: 18

## 2018-01-11 NOTE — Patient Instructions (Signed)
Chronic cough  - with clear cxr , normal nitiric oxide test and improving cough - this is very likely post viral reactive cough on its way out  - I recommend wait and watch approach for this  Dyspnea on exertion - this is getting worse but likely related to weight gain and lack of fitness  -however with chemical exposure history, one needs to keep an eye on this and ensure you are not at risk for pulmonary fibrosis  - we discussed doing a scan of chest now but have agreed we will monitor the situation beucase clinically risk for fibrosis is very low  Followup 3 months to pulmonary clinic; sooner if needed

## 2018-01-11 NOTE — Progress Notes (Signed)
Subjective:    Patient ID: Curtis BergJohn Greene Greene, male    DOB: 1966/02/20, 52 y.o.   MRN: [id number]  PCP Curtis Greene, Ravisankar, MD   HPI   IOV 01/11/2018  Chief Complaint  Patient presents with  . Consult    Referred by Dr. Kateri PlummerMorrow for cough.  Pt stated when we had the big snow in December, pt did some work outside and then developed a cold and due to not being able to go to the doctor because of the amount of snow symptoms became worse. C/o cough, increased SOB, and a lot of congestion.   52 year old male who who self reports being healthy.  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  During his work he does walk through tobacco laden areas with full of tobacco smoke.  He is exposed to the chemicals that he mixes.  He does not wear a mask.  Nevertheless he is never had any respiratory issues.  This past winter 2000 18/2019 he has gained 5-10 pounds because of sedentary lifestyle and winter weight gain from carbohydrate such as beer.  He says that early December 2018 when there was a big snowfall he had done some yard work the previous day and then once this no fall hit he got sick with respiratory infection.  He is up employer physician few days later in the middle of the week.  Was given antibiotic not otherwise specified but the cough never really improved.  However since then overall the cough has actually improved.  Initially it was severe in intensity with mucus.  Currently it is mild in intensity and he does have some mucus still.  The cough is present mostly in the evenings when he lies down or randomly when he is watching TV.  It is not waking him up in the middle of the night.  In the same time even though the cough is better he has had worsening dyspnea on exertion.  Both symptoms are associated with some  wheezing that is stable.  He gets dyspneic on exertion such as climbing a flight of stairs and relieved by rest.    FeNO 01/11/2018 - 18 ppb and normal  Walking desaturation test on 01/11/2018 185 feet x 3 laps on ROOM AIR:  did walk moderate pace. Tood deep breath. Did not desaturate. Rest pulse ox was 98%, final pulse ox was 97%. HR response 83/min at rest to 90/min at peak exertion. Patient Curtis Greene  Did not Desaturate < 88% . Curtis Greene did not  Desaturated </= 3% points. Curtis Greene yes did get tachyardic      cxr feb 2019 - clear (my personal visualziation)  Results for Curtis Greene, Curtis Greene (MRN [id number]) as of 01/11/2018 11:37  Ref. Range 08/22/2010 17:56 08/22/2010 18:11 03/19/2014 16:46  Creatinine Latest Ref Range: 0.50 - 1.35 mg/dL  1.0 7.820.85  Results for Curtis Greene, Curtis Greene (MRN [id number]) as of 01/11/2018 11:37  Ref. Range 08/22/2010 17:56 08/22/2010 18:11 03/19/2014 16:46  Eosinophils Absolute Latest Ref Range: 0.0 - 0.7 K/uL 0.1      has a past medical history of Vein disorder (2011).   reports that he has never smoked. He has never used smokeless tobacco.  Past Surgical History:  Procedure Laterality Date  . INCISION  AND DRAINAGE ABSCESS Left 03/19/2014   Procedure: INCISION AND DRAINAGE ABSCESS;  Surgeon: Robyne Askew, MD;  Location: [facility];  Service: General;  Laterality: Left;  . KNEE ARTHROSCOPY Left approx 1977    No Known Allergies   There is no immunization history on file for this patient.  Family History  Problem Relation Age of Onset  . Cancer Father        lung     Current Outpatient Medications:  .  cyclobenzaprine (FLEXERIL) 10 MG tablet, Take 1 tablet (10 mg total) by mouth 2 (two) times daily as needed for muscle spasms. (Patient not taking: Reported on 01/11/2018), Disp: 20 tablet, Rfl: 0 .  PROAIR HFA 108 (90 Base) MCG/ACT inhaler, inhale 1 to 2 puffs by mouth every 4 to 6 hours if needed, Disp: , Rfl: 0      Review of Systems  Constitutional:  Negative for fever and unexpected weight change.  HENT: Positive for congestion, postnasal drip and sore throat. Negative for dental problem, ear pain, nosebleeds, rhinorrhea, sinus pressure, sneezing and trouble swallowing.   Eyes: Negative for redness and itching.  Respiratory: Positive for cough, chest tightness, shortness of breath and wheezing.   Cardiovascular: Negative for palpitations and leg swelling.  Gastrointestinal: Negative for nausea and vomiting.  Genitourinary: Negative for dysuria.  Musculoskeletal: Negative for joint swelling.  Skin: Negative for rash.  Allergic/Immunologic: Negative.  Negative for environmental allergies, food allergies and immunocompromised state.  Neurological: Negative for headaches.  Hematological: Does not bruise/bleed easily.  Psychiatric/Behavioral: Positive for dysphoric mood. The patient is nervous/anxious.        Objective:   Physical Exam  Constitutional: He is oriented to person, place, and time. He appears well-developed and well-nourished. No distress.  HENT:  Head: Normocephalic and atraumatic.  Right Ear: External ear normal.  Left Ear: External ear normal.  Mouth/Throat: Oropharynx is clear and moist. No oropharyngeal exudate.  Eyes: Pupils are equal, round, and reactive to light. Conjunctivae and EOM are normal. Right eye exhibits no discharge. Left eye exhibits no discharge. No scleral icterus.  Neck: Normal range of motion. Neck supple. No JVD present. No tracheal deviation present. No thyromegaly present.  Cardiovascular: Normal rate, regular rhythm and intact distal pulses. Exam reveals no gallop and no friction rub.  No murmur heard. Pulmonary/Chest: Effort normal and breath sounds normal. No respiratory distress. He has no wheezes. He has no rales. He exhibits no tenderness.  ? crackles  Abdominal: Soft. Bowel sounds are normal. He exhibits no distension and no mass. There is no tenderness. There is no rebound and no guarding.   Mild visceral obesity +  Musculoskeletal: Normal range of motion. He exhibits no edema or tenderness.  Lymphadenopathy:    He has no cervical adenopathy.  Neurological: He is alert and oriented to person, place, and time. He has normal reflexes. No cranial nerve deficit. Coordination normal.  Skin: Skin is warm and dry. No rash noted. He is not diaphoretic. No erythema. No pallor.  Psychiatric: He has a normal mood and affect. His behavior is normal. Judgment and thought content normal.  Nursing note and vitals reviewed.   Vitals:   01/11/18 1125  BP: 120/78  Pulse: 88  SpO2: 96%  Weight: 239 lb 6.4 oz (108.6 kg)  Height: 6' (1.829 m)   Estimated body mass index is 32.47 kg/m as calculated from the following:   Height as of this encounter: 6' (1.829 m).   Weight as of this  encounter: 239 lb 6.4 oz (108.6 kg).        Assessment & Plan:     ICD-10-CM   1. Chronic cough R05   2. Dyspnea on exertion R06.09      Chronic cough  - with clear cxr , normal nitiric oxide test and improving cough - this is very likely post viral reactive cough on its way out  - I recommend wait and watch approach for this  Dyspnea on exertion - this is getting worse but likely related to weight gain and lack of fitness  -however with chemical exposure history, one needs to keep an eye on this and ensure you are not at risk for pulmonary fibrosis  - we discussed doing a scan of chest now but have agreed we will monitor the situation beucase clinically risk for fibrosis is very low  Followup 3 months to pulmonary clinic; sooner if needed   Dr. Kalman Shan, M.D., [facility].C.P Pulmonary and Critical Care Medicine Staff Physician, [facility] Director - Interstitial Lung Disease  Program  Pulmonary Fibrosis [facility] at [facility] [place], [place], [address]  Pager: [phone], If no answer or between  15:00h - 7:00h: call [phone] Telephone:  [phone]

## 2018-03-16 DIAGNOSIS — Z3009 Encounter for other general counseling and advice on contraception: Secondary | ICD-10-CM | POA: Diagnosis not present

## 2018-11-26 ENCOUNTER — Other Ambulatory Visit (HOSPITAL_COMMUNITY): Payer: Self-pay | Admitting: Physician Assistant

## 2018-11-26 ENCOUNTER — Ambulatory Visit (HOSPITAL_COMMUNITY)
Admission: RE | Admit: 2018-11-26 | Discharge: 2018-11-26 | Disposition: A | Discharge status: Home / Self Care | Payer: BLUE CROSS/BLUE SHIELD | Source: Ambulatory Visit | Attending: Physician Assistant | Admitting: Physician Assistant

## 2018-11-26 DIAGNOSIS — M5136 Other intervertebral disc degeneration, lumbar region: Secondary | ICD-10-CM | POA: Insufficient documentation

## 2018-11-26 DIAGNOSIS — M5137 Other intervertebral disc degeneration, lumbosacral region: Secondary | ICD-10-CM | POA: Diagnosis not present

## 2019-05-16 ENCOUNTER — Other Ambulatory Visit: Payer: Self-pay | Admitting: Physician Assistant

## 2019-05-16 DIAGNOSIS — M5137 Other intervertebral disc degeneration, lumbosacral region: Secondary | ICD-10-CM

## 2019-05-16 DIAGNOSIS — M51379 Other intervertebral disc degeneration, lumbosacral region without mention of lumbar back pain or lower extremity pain: Secondary | ICD-10-CM

## 2019-05-18 DIAGNOSIS — M25552 Pain in left hip: Secondary | ICD-10-CM | POA: Diagnosis not present

## 2019-05-20 ENCOUNTER — Ambulatory Visit
Admission: RE | Admit: 2019-05-20 | Discharge: 2019-05-20 | Disposition: A | Discharge status: Home / Self Care | Payer: BC Managed Care – PPO | Source: Ambulatory Visit | Attending: Physician Assistant | Admitting: Physician Assistant

## 2019-05-20 ENCOUNTER — Other Ambulatory Visit: Payer: Self-pay

## 2019-05-20 DIAGNOSIS — M5137 Other intervertebral disc degeneration, lumbosacral region: Secondary | ICD-10-CM | POA: Diagnosis not present

## 2019-05-24 ENCOUNTER — Other Ambulatory Visit: Payer: Self-pay | Admitting: Physician Assistant

## 2019-05-24 ENCOUNTER — Ambulatory Visit
Admission: RE | Admit: 2019-05-24 | Discharge: 2019-05-24 | Disposition: A | Discharge status: Home / Self Care | Payer: BC Managed Care – PPO | Source: Ambulatory Visit | Attending: Physician Assistant | Admitting: Physician Assistant

## 2019-05-24 DIAGNOSIS — M25552 Pain in left hip: Secondary | ICD-10-CM

## 2019-05-24 DIAGNOSIS — M5137 Other intervertebral disc degeneration, lumbosacral region: Secondary | ICD-10-CM | POA: Diagnosis not present

## 2019-05-29 DIAGNOSIS — M25552 Pain in left hip: Secondary | ICD-10-CM | POA: Diagnosis not present

## 2019-06-14 DIAGNOSIS — M549 Dorsalgia, unspecified: Secondary | ICD-10-CM | POA: Diagnosis not present

## 2019-06-15 DIAGNOSIS — Z139 Encounter for screening, unspecified: Secondary | ICD-10-CM | POA: Diagnosis not present

## 2019-06-27 DIAGNOSIS — M79669 Pain in unspecified lower leg: Secondary | ICD-10-CM | POA: Diagnosis not present

## 2019-06-27 DIAGNOSIS — M5135 Other intervertebral disc degeneration, thoracolumbar region: Secondary | ICD-10-CM | POA: Diagnosis not present

## 2019-06-27 DIAGNOSIS — M5136 Other intervertebral disc degeneration, lumbar region: Secondary | ICD-10-CM | POA: Diagnosis not present

## 2019-06-27 DIAGNOSIS — M5137 Other intervertebral disc degeneration, lumbosacral region: Secondary | ICD-10-CM | POA: Diagnosis not present

## 2019-07-04 DIAGNOSIS — M5440 Lumbago with sciatica, unspecified side: Secondary | ICD-10-CM | POA: Diagnosis not present

## 2019-07-04 DIAGNOSIS — Z6831 Body mass index (BMI) 31.0-31.9, adult: Secondary | ICD-10-CM | POA: Diagnosis not present

## 2019-07-24 DIAGNOSIS — Z0000 Encounter for general adult medical examination without abnormal findings: Secondary | ICD-10-CM | POA: Diagnosis not present

## 2019-07-24 DIAGNOSIS — Z125 Encounter for screening for malignant neoplasm of prostate: Secondary | ICD-10-CM | POA: Diagnosis not present

## 2019-07-24 DIAGNOSIS — Z23 Encounter for immunization: Secondary | ICD-10-CM | POA: Diagnosis not present

## 2019-07-24 DIAGNOSIS — Z6833 Body mass index (BMI) 33.0-33.9, adult: Secondary | ICD-10-CM | POA: Diagnosis not present

## 2019-10-06 DIAGNOSIS — H00014 Hordeolum externum left upper eyelid: Secondary | ICD-10-CM | POA: Diagnosis not present

## 2020-08-27 DIAGNOSIS — Z125 Encounter for screening for malignant neoplasm of prostate: Secondary | ICD-10-CM | POA: Diagnosis not present

## 2020-08-27 DIAGNOSIS — Z13220 Encounter for screening for lipoid disorders: Secondary | ICD-10-CM | POA: Diagnosis not present

## 2020-08-27 DIAGNOSIS — Z0000 Encounter for general adult medical examination without abnormal findings: Secondary | ICD-10-CM | POA: Diagnosis not present

## 2020-08-27 DIAGNOSIS — E785 Hyperlipidemia, unspecified: Secondary | ICD-10-CM | POA: Diagnosis not present

## 2020-10-01 DIAGNOSIS — M47899 Other spondylosis, site unspecified: Secondary | ICD-10-CM | POA: Diagnosis not present

## 2020-10-01 DIAGNOSIS — M47816 Spondylosis without myelopathy or radiculopathy, lumbar region: Secondary | ICD-10-CM | POA: Diagnosis not present

## 2020-10-07 DIAGNOSIS — M47816 Spondylosis without myelopathy or radiculopathy, lumbar region: Secondary | ICD-10-CM | POA: Diagnosis not present

## 2020-11-06 DIAGNOSIS — Z1152 Encounter for screening for COVID-19: Secondary | ICD-10-CM | POA: Diagnosis not present

## 2020-11-22 DIAGNOSIS — R972 Elevated prostate specific antigen [PSA]: Secondary | ICD-10-CM | POA: Diagnosis not present

## 2020-11-22 DIAGNOSIS — Z125 Encounter for screening for malignant neoplasm of prostate: Secondary | ICD-10-CM | POA: Diagnosis not present

## 2020-12-25 DIAGNOSIS — H01003 Unspecified blepharitis right eye, unspecified eyelid: Secondary | ICD-10-CM | POA: Diagnosis not present

## 2021-07-18 DIAGNOSIS — R059 Cough, unspecified: Secondary | ICD-10-CM | POA: Diagnosis not present

## 2021-08-25 DIAGNOSIS — U071 COVID-19: Secondary | ICD-10-CM | POA: Diagnosis not present

## 2021-09-15 ENCOUNTER — Other Ambulatory Visit: Payer: Self-pay | Admitting: Family Medicine

## 2021-09-15 DIAGNOSIS — R058 Other specified cough: Secondary | ICD-10-CM

## 2021-09-15 DIAGNOSIS — Z0000 Encounter for general adult medical examination without abnormal findings: Secondary | ICD-10-CM | POA: Diagnosis not present

## 2021-09-15 DIAGNOSIS — Z125 Encounter for screening for malignant neoplasm of prostate: Secondary | ICD-10-CM | POA: Diagnosis not present

## 2021-09-15 DIAGNOSIS — R059 Cough, unspecified: Secondary | ICD-10-CM | POA: Diagnosis not present

## 2021-09-15 DIAGNOSIS — R0602 Shortness of breath: Secondary | ICD-10-CM | POA: Diagnosis not present

## 2021-09-15 DIAGNOSIS — E785 Hyperlipidemia, unspecified: Secondary | ICD-10-CM | POA: Diagnosis not present

## 2021-09-16 ENCOUNTER — Other Ambulatory Visit: Payer: Self-pay

## 2021-09-16 ENCOUNTER — Ambulatory Visit
Admission: RE | Admit: 2021-09-16 | Discharge: 2021-09-16 | Disposition: A | Discharge status: Home / Self Care | Payer: BC Managed Care – PPO | Source: Ambulatory Visit | Attending: Family Medicine | Admitting: Family Medicine

## 2021-09-16 DIAGNOSIS — R058 Other specified cough: Secondary | ICD-10-CM

## 2021-09-16 DIAGNOSIS — R059 Cough, unspecified: Secondary | ICD-10-CM | POA: Diagnosis not present

## 2021-10-06 DIAGNOSIS — Z6831 Body mass index (BMI) 31.0-31.9, adult: Secondary | ICD-10-CM | POA: Diagnosis not present

## 2021-10-06 DIAGNOSIS — M5442 Lumbago with sciatica, left side: Secondary | ICD-10-CM | POA: Diagnosis not present

## 2021-10-14 ENCOUNTER — Other Ambulatory Visit: Payer: Self-pay | Admitting: Neurosurgery

## 2021-10-14 DIAGNOSIS — M5442 Lumbago with sciatica, left side: Secondary | ICD-10-CM

## 2021-11-20 ENCOUNTER — Ambulatory Visit
Admission: RE | Admit: 2021-11-20 | Discharge: 2021-11-20 | Disposition: A | Discharge status: Home / Self Care | Payer: BC Managed Care – PPO | Source: Ambulatory Visit | Attending: Neurosurgery | Admitting: Neurosurgery

## 2021-11-20 DIAGNOSIS — M5126 Other intervertebral disc displacement, lumbar region: Secondary | ICD-10-CM | POA: Diagnosis not present

## 2021-11-20 DIAGNOSIS — M5442 Lumbago with sciatica, left side: Secondary | ICD-10-CM

## 2021-11-20 DIAGNOSIS — M5127 Other intervertebral disc displacement, lumbosacral region: Secondary | ICD-10-CM | POA: Diagnosis not present

## 2021-11-20 DIAGNOSIS — M48061 Spinal stenosis, lumbar region without neurogenic claudication: Secondary | ICD-10-CM | POA: Diagnosis not present

## 2022-01-09 DIAGNOSIS — R0981 Nasal congestion: Secondary | ICD-10-CM | POA: Diagnosis not present

## 2022-01-09 DIAGNOSIS — U071 COVID-19: Secondary | ICD-10-CM | POA: Diagnosis not present

## 2022-01-09 DIAGNOSIS — R059 Cough, unspecified: Secondary | ICD-10-CM | POA: Diagnosis not present

## 2022-01-26 DIAGNOSIS — M5442 Lumbago with sciatica, left side: Secondary | ICD-10-CM | POA: Diagnosis not present

## 2022-01-26 DIAGNOSIS — M5136 Other intervertebral disc degeneration, lumbar region: Secondary | ICD-10-CM | POA: Diagnosis not present

## 2022-01-26 DIAGNOSIS — M4316 Spondylolisthesis, lumbar region: Secondary | ICD-10-CM | POA: Diagnosis not present

## 2022-01-27 ENCOUNTER — Other Ambulatory Visit: Payer: Self-pay | Admitting: Neurosurgery

## 2022-02-06 NOTE — Pre-Procedure Instructions (Signed)
Surgical Instructions ? ? ? Your procedure is scheduled on Friday, April 28th. ? Report to [facility] Main Entrance "A" at 10:15 A.M., then check in with the Admitting office. ? Call this number if you have problems the morning of surgery: ? [phone] ? ? If you have any questions prior to your surgery date call [phone]: Open Monday-Friday 8am-4pm ? ? ? Remember: ? Do not eat after midnight the night before your surgery ? ?You may drink clear liquids until 9:15 a.m. the morning of your surgery.   ?Clear liquids allowed are: Water, Non-Citrus Juices (without pulp), Carbonated Beverages, Clear Tea, Black Coffee Only (NO MILK, CREAM OR POWDERED CREAMER of any kind), and Gatorade. ?  ? Take these medicines the morning of surgery: ?PROAIR Inhaler-if needed, please bring inhaler to the hospital.  ? ?As of today, STOP taking any Aspirin (unless otherwise instructed by your surgeon) Aleve, Naproxen, Ibuprofen, Motrin, Advil, Goody's, BC's, all herbal medications, fish oil, and all vitamins. ?         ?           ?Do NOT Smoke (Tobacco/Vaping) for 24 hours prior to your procedure. ? ?If you use a CPAP at night, you may bring your mask/headgear for your overnight stay. ?  ?Contacts, glasses, piercing's, hearing aid's, dentures or partials may not be worn into surgery, please bring cases for these belongings.  ?  ?For patients admitted to the hospital, discharge time will be determined by your treatment team. ?  ?Patients discharged the day of surgery will not be allowed to drive home, and someone needs to stay with them for 24 hours. ? ?SURGICAL WAITING ROOM VISITATION ?Patients having surgery or a procedure may have two support people in the waiting room. These visitors may be switched out with other visitors if needed. ?Children under the age of 14 must have an adult accompany them who is not the patient. ?If the patient needs to stay at the hospital during part of their recovery, the visitor guidelines for  inpatient rooms apply. ? ?Please refer to the [facility] website for the visitor guidelines for Inpatients (after your surgery is over and you are in a regular room).  ? ? ?Special instructions:   ?[facility]- Preparing For Surgery ? ?Before surgery, you can play an important role. Because skin is not sterile, your skin needs to be as free of germs as possible. You can reduce the number of germs on your skin by washing with CHG (chlorahexidine gluconate) Soap before surgery.  CHG is an antiseptic cleaner which kills germs and bonds with the skin to continue killing germs even after washing.   ? ?Oral Hygiene is also important to reduce your risk of infection.  Remember - BRUSH YOUR TEETH THE MORNING OF SURGERY WITH YOUR REGULAR TOOTHPASTE ? ?Please do not use if you have an allergy to CHG or antibacterial soaps. If your skin becomes reddened/irritated stop using the CHG.  ?Do not shave (including legs and underarms) for at least 48 hours prior to first CHG shower. It is OK to shave your face. ? ?Please follow these instructions carefully. ?  ?Shower the NIGHT BEFORE SURGERY and the MORNING OF SURGERY ? ?If you chose to wash your hair, wash your hair first as usual with your normal shampoo. ? ?After you shampoo, rinse your hair and body thoroughly to remove the shampoo. ? ?Use CHG Soap as you would any other liquid soap. You can apply CHG directly to the skin  and wash gently with a scrungie or a clean washcloth.  ? ?Apply the CHG Soap to your body ONLY FROM THE NECK DOWN.  Do not use on open wounds or open sores. Avoid contact with your eyes, ears, mouth and genitals (private parts). Wash Face and genitals (private parts)  with your normal soap.  ? ?Wash thoroughly, paying special attention to the area where your surgery will be performed. ? ?Thoroughly rinse your body with warm water from the neck down. ? ?DO NOT shower/wash with your normal soap after using and rinsing off the CHG Soap. ? ?Pat yourself dry with a  CLEAN TOWEL. ? ?Wear CLEAN PAJAMAS to bed the night before surgery ? ?Place CLEAN SHEETS on your bed the night before your surgery ? ?DO NOT SLEEP WITH PETS. ? ? ?Day of Surgery: ?Take a shower with CHG soap. ?Do not wear jewelry ?Do not wear lotions, powders, colognes, or deodorant. ?Men may shave face and neck. ?Do not bring valuables to the hospital.  ?[facility] is not responsible for any belongings or valuables. ?Wear Clean/Comfortable clothing the morning of surgery ?Remember to brush your teeth WITH YOUR REGULAR TOOTHPASTE. ?  ?Please read over the following fact sheets that you were given. ? ? ? ?If you received a COVID test during your pre-op visit  it is requested that you wear a mask when out in public, stay away from anyone that may not be feeling well and notify your surgeon if you develop symptoms. If you have been in contact with anyone that has tested positive in the last 10 days please notify you surgeon. ? ?

## 2022-02-09 ENCOUNTER — Encounter (HOSPITAL_COMMUNITY): Payer: Self-pay

## 2022-02-09 ENCOUNTER — Other Ambulatory Visit: Payer: Self-pay

## 2022-02-09 ENCOUNTER — Encounter (HOSPITAL_COMMUNITY)
Admission: RE | Admit: 2022-02-09 | Discharge: 2022-02-09 | Disposition: A | Discharge status: Home / Self Care | Payer: BC Managed Care – PPO | Source: Ambulatory Visit | Attending: Neurosurgery | Admitting: Neurosurgery

## 2022-02-09 VITALS — BP 130/82 | HR 82 | Temp 98.2°F | Resp 17 | Ht 72.0 in | Wt 233.9 lb

## 2022-02-09 DIAGNOSIS — Z789 Other specified health status: Secondary | ICD-10-CM

## 2022-02-09 DIAGNOSIS — Z01812 Encounter for preprocedural laboratory examination: Secondary | ICD-10-CM | POA: Diagnosis not present

## 2022-02-09 DIAGNOSIS — Z01818 Encounter for other preprocedural examination: Secondary | ICD-10-CM

## 2022-02-09 DIAGNOSIS — F1090 Alcohol use, unspecified, uncomplicated: Secondary | ICD-10-CM | POA: Insufficient documentation

## 2022-02-09 LAB — SURGICAL PCR SCREEN

## 2022-02-09 LAB — CBC
HCT: 43.2 % (ref 39.0–52.0)
Hemoglobin: 13.7 g/dL (ref 13.0–17.0)
MCH: 28.9 pg (ref 26.0–34.0)
MCHC: 31.7 g/dL (ref 30.0–36.0)
MCV: 91.1 fL (ref 80.0–100.0)
Platelets: 226 10*3/uL (ref 150–400)
RBC: 4.74 MIL/uL (ref 4.22–5.81)
RDW: 14.3 % (ref 11.5–15.5)
WBC: 7 10*3/uL (ref 4.0–10.5)
nRBC: 0 % (ref 0.0–0.2)

## 2022-02-09 LAB — COMPREHENSIVE METABOLIC PANEL
ALT: 19 U/L (ref 0–44)
AST: 20 U/L (ref 15–41)
Albumin: 4.2 g/dL (ref 3.5–5.0)
Alkaline Phosphatase: 40 U/L (ref 38–126)
Anion gap: 8 (ref 5–15)
BUN: 9 mg/dL (ref 6–20)
CO2: 30 mmol/L (ref 22–32)
Calcium: 9.1 mg/dL (ref 8.9–10.3)
Chloride: 103 mmol/L (ref 98–111)
Creatinine, Ser: 0.99 mg/dL (ref 0.61–1.24)
GFR, Estimated: >60 mL/min (ref >60)
Glucose, Bld: 86 mg/dL (ref 70–99)
Potassium: 3.8 mmol/L (ref 3.5–5.1)
Sodium: 141 mmol/L (ref 135–145)
Total Bilirubin: 0.8 mg/dL (ref 0.3–1.2)
Total Protein: 7.5 g/dL (ref 6.5–8.1)

## 2022-02-09 LAB — TYPE AND SCREEN
ABO/RH(D): A POS
Antibody Screen: NEGATIVE

## 2022-02-09 NOTE — Progress Notes (Signed)
PCP - Dr. Farris Has ?Cardiologist - denies ? ?PPM/ICD - n/a ? ?Chest x-ray - n/a ?EKG - n/a ?Stress Test - denies ?ECHO - denies ?Cardiac Cath - denies ? ?Sleep Study - denies ?CPAP - denies ? ?Blood Thinner Instructions: n/a ?Aspirin Instructions: n/a ? ?ERAS Protcol -Clear liquids until 0915 DOS ?PRE-SURGERY Ensure or G2- none ordered. ? ?COVID TEST- n/a, pt tested positive 01/09/22. ? ?Anesthesia review: No ? ?Patient denies shortness of breath, fever, cough and chest pain at PAT appointment ? ? ?All instructions explained to the patient, with a verbal understanding of the material. Patient agrees to go over the instructions while at home for a better understanding. Patient also instructed to self quarantine after being tested for COVID-19. The opportunity to ask questions was provided. ? ? ?

## 2022-02-13 ENCOUNTER — Encounter (HOSPITAL_COMMUNITY): Admission: RE | Payer: Self-pay | Source: Home / Self Care

## 2022-02-13 ENCOUNTER — Ambulatory Visit (HOSPITAL_COMMUNITY): Admission: RE | Admit: 2022-02-13 | Payer: BC Managed Care – PPO | Source: Home / Self Care | Admitting: Neurosurgery

## 2022-02-13 SURGERY — POSTERIOR LUMBAR FUSION 1 LEVEL
Anesthesia: General | Laterality: Left

## 2022-02-17 DIAGNOSIS — M2569 Stiffness of other specified joint, not elsewhere classified: Secondary | ICD-10-CM | POA: Diagnosis not present

## 2022-02-17 DIAGNOSIS — M625A2 Muscle wasting and atrophy, not elsewhere classified, back, lumbosacral: Secondary | ICD-10-CM | POA: Diagnosis not present

## 2022-02-17 DIAGNOSIS — M4316 Spondylolisthesis, lumbar region: Secondary | ICD-10-CM | POA: Diagnosis not present

## 2022-02-17 DIAGNOSIS — R293 Abnormal posture: Secondary | ICD-10-CM | POA: Diagnosis not present

## 2022-02-20 DIAGNOSIS — M2569 Stiffness of other specified joint, not elsewhere classified: Secondary | ICD-10-CM | POA: Diagnosis not present

## 2022-02-20 DIAGNOSIS — R293 Abnormal posture: Secondary | ICD-10-CM | POA: Diagnosis not present

## 2022-02-20 DIAGNOSIS — M4316 Spondylolisthesis, lumbar region: Secondary | ICD-10-CM | POA: Diagnosis not present

## 2022-02-20 DIAGNOSIS — M625A2 Muscle wasting and atrophy, not elsewhere classified, back, lumbosacral: Secondary | ICD-10-CM | POA: Diagnosis not present

## 2022-02-23 DIAGNOSIS — M4316 Spondylolisthesis, lumbar region: Secondary | ICD-10-CM | POA: Diagnosis not present

## 2022-02-23 DIAGNOSIS — M2569 Stiffness of other specified joint, not elsewhere classified: Secondary | ICD-10-CM | POA: Diagnosis not present

## 2022-02-23 DIAGNOSIS — M625A2 Muscle wasting and atrophy, not elsewhere classified, back, lumbosacral: Secondary | ICD-10-CM | POA: Diagnosis not present

## 2022-02-23 DIAGNOSIS — R293 Abnormal posture: Secondary | ICD-10-CM | POA: Diagnosis not present

## 2022-02-25 DIAGNOSIS — R293 Abnormal posture: Secondary | ICD-10-CM | POA: Diagnosis not present

## 2022-02-25 DIAGNOSIS — M4316 Spondylolisthesis, lumbar region: Secondary | ICD-10-CM | POA: Diagnosis not present

## 2022-02-25 DIAGNOSIS — M2569 Stiffness of other specified joint, not elsewhere classified: Secondary | ICD-10-CM | POA: Diagnosis not present

## 2022-02-25 DIAGNOSIS — M625A2 Muscle wasting and atrophy, not elsewhere classified, back, lumbosacral: Secondary | ICD-10-CM | POA: Diagnosis not present

## 2022-03-02 DIAGNOSIS — R293 Abnormal posture: Secondary | ICD-10-CM | POA: Diagnosis not present

## 2022-03-02 DIAGNOSIS — M625A2 Muscle wasting and atrophy, not elsewhere classified, back, lumbosacral: Secondary | ICD-10-CM | POA: Diagnosis not present

## 2022-03-02 DIAGNOSIS — M2569 Stiffness of other specified joint, not elsewhere classified: Secondary | ICD-10-CM | POA: Diagnosis not present

## 2022-03-02 DIAGNOSIS — M4316 Spondylolisthesis, lumbar region: Secondary | ICD-10-CM | POA: Diagnosis not present

## 2022-03-12 DIAGNOSIS — M4316 Spondylolisthesis, lumbar region: Secondary | ICD-10-CM | POA: Diagnosis not present

## 2022-03-12 DIAGNOSIS — M625A2 Muscle wasting and atrophy, not elsewhere classified, back, lumbosacral: Secondary | ICD-10-CM | POA: Diagnosis not present

## 2022-03-12 DIAGNOSIS — M2569 Stiffness of other specified joint, not elsewhere classified: Secondary | ICD-10-CM | POA: Diagnosis not present

## 2022-03-12 DIAGNOSIS — R293 Abnormal posture: Secondary | ICD-10-CM | POA: Diagnosis not present

## 2022-03-27 DIAGNOSIS — R0602 Shortness of breath: Secondary | ICD-10-CM | POA: Diagnosis not present

## 2022-03-27 DIAGNOSIS — R059 Cough, unspecified: Secondary | ICD-10-CM | POA: Diagnosis not present

## 2022-03-27 DIAGNOSIS — R062 Wheezing: Secondary | ICD-10-CM | POA: Diagnosis not present

## 2022-03-27 DIAGNOSIS — R079 Chest pain, unspecified: Secondary | ICD-10-CM | POA: Diagnosis not present

## 2022-03-30 DIAGNOSIS — M2569 Stiffness of other specified joint, not elsewhere classified: Secondary | ICD-10-CM | POA: Diagnosis not present

## 2022-03-30 DIAGNOSIS — M4316 Spondylolisthesis, lumbar region: Secondary | ICD-10-CM | POA: Diagnosis not present

## 2022-03-30 DIAGNOSIS — R293 Abnormal posture: Secondary | ICD-10-CM | POA: Diagnosis not present

## 2022-03-30 DIAGNOSIS — M625A2 Muscle wasting and atrophy, not elsewhere classified, back, lumbosacral: Secondary | ICD-10-CM | POA: Diagnosis not present

## 2022-03-31 ENCOUNTER — Encounter: Payer: Self-pay | Admitting: Internal Medicine

## 2022-03-31 ENCOUNTER — Ambulatory Visit (INDEPENDENT_AMBULATORY_CARE_PROVIDER_SITE_OTHER): Payer: BC Managed Care – PPO | Admitting: Internal Medicine

## 2022-03-31 VITALS — BP 118/72 | HR 75 | Ht 72.0 in | Wt 234.6 lb

## 2022-03-31 DIAGNOSIS — R072 Precordial pain: Secondary | ICD-10-CM | POA: Diagnosis not present

## 2022-03-31 MED ORDER — METOPROLOL TARTRATE 50 MG PO TABS: 50.0000 mg | ORAL_TABLET | Freq: Once | ORAL | 0 refills | Status: DC

## 2022-03-31 NOTE — Patient Instructions (Signed)
Medication Instructions:  PLEASE TAKE METOPROLOL TARTRATE 50MG - 2 HOURS PRIOR TO CCTA SCAN  *If you need a refill on your cardiac medications before your next appointment, please call your pharmacy*  Lab Work: BMET- TODAY  If you have labs (blood work) drawn today and your tests are completely normal, you will receive your results only by: [place] (if you have MyChart) OR A paper copy in the mail If you have any lab test that is abnormal or we need to change your treatment, we will call you to review the results.  Testing/Procedures:   Your cardiac CT will be scheduled at one of the below locations:   [facility] [address] [place],  [address] [phone]  If scheduled at [facility], please arrive at the [facility] and Children's Entrance (Entrance C2) of [facility] 30 minutes prior to test start time. You can use the FREE valet parking offered at entrance C (encouraged to control the heart rate for the test)  Proceed to the [facility] Radiology Department (first floor) to check-in and test prep.  All radiology patients and guests should use entrance C2 at [facility], accessed from [facility], even though the hospital's physical address listed is [address].    Please follow these instructions carefully (unless otherwise directed):  Hold all erectile dysfunction medications at least 3 days (72 hrs) prior to test.  On the Night Before the Test: Be sure to Drink plenty of water. Do not consume any caffeinated/decaffeinated beverages or chocolate 12 hours prior to your test. Do not take any antihistamines 12 hours prior to your test.  On the Day of the Test: Drink plenty of water until 1 hour prior to the test. Do not eat any food 4 hours prior to the test. You may take your regular medications prior to the test.  Take metoprolol (Lopressor) two hours prior to test. HOLD  Furosemide/Hydrochlorothiazide morning of the test.      After the Test: Drink plenty of water. After receiving IV contrast, you may experience a mild flushed feeling. This is normal. On occasion, you may experience a mild rash up to 24 hours after the test. This is not dangerous. If this occurs, you can take Benadryl 25 mg and increase your fluid intake. If you experience trouble breathing, this can be serious. If it is severe call 911 IMMEDIATELY. If it is mild, please call our office. If you take any of these medications: Glipizide/Metformin, Avandament, Glucavance, please do not take 48 hours after completing test unless otherwise instructed.  We will call to schedule your test 2-4 weeks out understanding that some insurance companies will need an authorization prior to the service being performed.   For non-scheduling related questions, please contact the cardiac imaging nurse navigator should you have any questions/concerns: Marchia Bond, Cardiac Imaging Nurse Navigator Gordy Clement, Cardiac Imaging Nurse Navigator [facility] Direct Office Dial: [phone]   For scheduling needs, including cancellations and rescheduling, please call [place], [phone].   Follow-Up: At [facility], you and your health needs are our priority.  As part of our continuing mission to provide you with exceptional heart care, we have created designated Provider Care Teams.  These Care Teams include your primary Cardiologist (physician) and Advanced Practice Providers (APPs -  Physician Assistants and Nurse Practitioners) who all work together to provide you with the care you need, when you need it.  We recommend signing  up for the patient portal called "MyChart".  Sign up information is provided on this After Visit Summary.  MyChart is used to connect with patients for Virtual Visits (Telemedicine).  Patients are able to view lab/test results, encounter notes, upcoming  appointments, etc.  Non-urgent messages can be sent to your provider as well.   To learn more about what you can do with MyChart, go to [email or web address].    Your next appointment:   AS NEEDED   The format for your next appointment:   In Person  Provider:   Janina Mayo, MD

## 2022-03-31 NOTE — Progress Notes (Signed)
Cardiology Office Note:    Date:  03/31/2022   ID:  Curtis Greene, DOB 08/26/1966, MRN [id number]  PCP:  Farris Has, MD   [facility] Providers Cardiologist:  None     Referring MD: Farris Has, MD   No chief complaint on file. Chest pain  History of Present Illness:    Curtis Greene is a 56 y.o. male with no significant PMHx; referral for chest pain  He notes L sided chest pain and radiates to his arm. In the last 4-6 weeks. Not with activity. He has  talking coughing. No dyspnea with exertion. No premature CAD. His brother died in 66; unknown cause ; ?ALS. No smoking hx. 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     Allergies:   Patient has no known allergies.   Social History   Socioeconomic History   Marital status: Married    Spouse name: Not on file   Number of children: Not on file   Years of education: Not on file   Highest education level: Not on file  Occupational History   Not on file  Tobacco Use   Smoking status: Never   Smokeless tobacco: Never  Vaping Use   Vaping Use: Never used  Substance and Sexual Activity   Alcohol use: Yes    Alcohol/week: 29.0 standard drinks of alcohol    Types: 21 Cans of beer, 8 Shots of liquor per week    Comment: a pint of bourbon on the weekends.   Drug use: Not Currently    Types: Marijuana    Comment: stopped in 1995   Sexual activity: Not on file  Other Topics Concern   Not on file  Social History Narrative   Not on file    Social Determinants of Health   Financial Resource Strain: Not on file  Food Insecurity: Not on file  Transportation Needs: Not on file  Physical Activity: Not on file  Stress: Not on file  Social Connections: Not on file     Family History: The patient's family history includes Cancer in his father. Per above.  ROS:   Please see the history of present illness.     All other systems reviewed and are negative.  EKGs/Labs/Other Studies Reviewed:    The following studies were reviewed today:   EKG:  EKG is  ordered today.  The ekg ordered today demonstrates   03/31/2022-NSR  Recent Labs: 02/09/2022: ALT 19; BUN 9; Creatinine, Ser 0.99; Hemoglobin 13.7; Platelets 226; Potassium 3.8; Sodium 141  Recent Lipid Panel No results found for: "CHOL", "TRIG", "HDL", "CHOLHDL", "VLDL", "LDLCALC", "LDLDIRECT"   Risk Assessment/Calculations:           Physical Exam:    VS:   Vitals:   03/31/22 1403  BP: 118/72  Pulse: 75  SpO2: 95%      Wt Readings from Last 3 Encounters:  02/09/22 233 lb 14.4 oz (106.1 kg)  01/11/18 239 lb 6.4 oz (108.6 kg)  05/16/14 223 lb (101.2 kg)     GEN:  Well nourished, well developed in no acute distress HEENT: Normal NECK: No JVD LYMPHATICS: No lymphadenopathy CARDIAC: RRR, no murmurs, rubs, gallops RESPIRATORY:  Clear to auscultation without rales, wheezing or rhonchi  ABDOMEN: Soft, non-tender, non-distended MUSCULOSKELETAL:  No edema; No deformity  SKIN: Warm and dry NEUROLOGIC:  Alert and oriented x 3 PSYCHIATRIC:  Normal affect   ASSESSMENT:    #Angina: has intermittent chest discomfort, left sided. Risk includes age. Will determine if related to coronary artery disease and will also allow for CVD risk stratification.  Recommend to continue with lifestyle modification and CVD risk mitigation (yearly A1c, lipid monitoring); otherwise if his coronary CTA does not show any FFR+ lesions, he does not need to follow with cardiology at  this time  PLAN:    In order of problems listed above:  Coronary CTA Follow up as needed        Medication Adjustments/Labs and Tests Ordered: Current medicines are reviewed at length with the patient today.  Concerns regarding medicines are outlined above.  No orders of the defined types were placed in this encounter.  No orders of the defined types were placed in this encounter.   There are no Patient Instructions on file for this visit.   Signed, Maisie Fus, MD  03/31/2022 12:55 PM     [facility]

## 2022-04-01 LAB — BASIC METABOLIC PANEL
BUN/Creatinine Ratio: 10 (ref 9–20)
BUN: 10 mg/dL (ref 6–24)
CO2: 25 mmol/L (ref 20–29)
Calcium: 9.8 mg/dL (ref 8.7–10.2)
Chloride: 101 mmol/L (ref 96–106)
Creatinine, Ser: 1 mg/dL (ref 0.76–1.27)
Glucose: 90 mg/dL (ref 70–99)
Potassium: 4.2 mmol/L (ref 3.5–5.2)
Sodium: 142 mmol/L (ref 134–144)
eGFR: 89 mL/min/{1.73_m2} (ref >59)

## 2022-04-02 DIAGNOSIS — M625A2 Muscle wasting and atrophy, not elsewhere classified, back, lumbosacral: Secondary | ICD-10-CM | POA: Diagnosis not present

## 2022-04-02 DIAGNOSIS — M4316 Spondylolisthesis, lumbar region: Secondary | ICD-10-CM | POA: Diagnosis not present

## 2022-04-02 DIAGNOSIS — R293 Abnormal posture: Secondary | ICD-10-CM | POA: Diagnosis not present

## 2022-04-02 DIAGNOSIS — M2569 Stiffness of other specified joint, not elsewhere classified: Secondary | ICD-10-CM | POA: Diagnosis not present

## 2022-04-09 DIAGNOSIS — M625A2 Muscle wasting and atrophy, not elsewhere classified, back, lumbosacral: Secondary | ICD-10-CM | POA: Diagnosis not present

## 2022-04-09 DIAGNOSIS — M4316 Spondylolisthesis, lumbar region: Secondary | ICD-10-CM | POA: Diagnosis not present

## 2022-04-09 DIAGNOSIS — M2569 Stiffness of other specified joint, not elsewhere classified: Secondary | ICD-10-CM | POA: Diagnosis not present

## 2022-04-09 DIAGNOSIS — R293 Abnormal posture: Secondary | ICD-10-CM | POA: Diagnosis not present

## 2022-04-15 ENCOUNTER — Institutional Professional Consult (permissible substitution): Payer: BC Managed Care – PPO | Admitting: Pulmonary Disease

## 2022-04-17 ENCOUNTER — Encounter (HOSPITAL_COMMUNITY): Payer: Self-pay | Admitting: Emergency Medicine

## 2022-04-20 ENCOUNTER — Institutional Professional Consult (permissible substitution): Payer: BC Managed Care – PPO | Admitting: Pulmonary Disease

## 2022-04-22 ENCOUNTER — Ambulatory Visit (HOSPITAL_COMMUNITY): Admission: RE | Admit: 2022-04-22 | Payer: BC Managed Care – PPO | Source: Ambulatory Visit

## 2022-04-29 ENCOUNTER — Other Ambulatory Visit (HOSPITAL_COMMUNITY): Payer: Self-pay | Admitting: Emergency Medicine

## 2022-04-29 DIAGNOSIS — R072 Precordial pain: Secondary | ICD-10-CM

## 2022-04-29 MED ORDER — METOPROLOL TARTRATE 50 MG PO TABS: 50.0000 mg | ORAL_TABLET | Freq: Once | ORAL | 0 refills | Status: DC

## 2022-04-30 DIAGNOSIS — M5136 Other intervertebral disc degeneration, lumbar region: Secondary | ICD-10-CM | POA: Diagnosis not present

## 2022-04-30 DIAGNOSIS — M4316 Spondylolisthesis, lumbar region: Secondary | ICD-10-CM | POA: Diagnosis not present

## 2022-04-30 DIAGNOSIS — Z6832 Body mass index (BMI) 32.0-32.9, adult: Secondary | ICD-10-CM | POA: Diagnosis not present

## 2022-05-11 NOTE — Progress Notes (Unsigned)
Synopsis: Referred for cough by Farris Has, MD  Subjective:   PATIENT ID: Curtis Greene GENDER: male DOB: November 14, 1965, MRN: [id number]  No chief complaint on file.  55yM with history of referred for cough, seen in past by Dr. 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  Worked in various jobs with exposures to different chemicals.   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     Family History  Problem Relation Age of Onset   Cancer Father        lung     Past Surgical History:  Procedure Laterality Date   INCISION AND DRAINAGE ABSCESS Left 03/19/2014   Procedure: INCISION AND DRAINAGE ABSCESS;  Surgeon: Robyne Askew, MD;  Location: [facility];  Service: General;  Laterality: Left;   KNEE ARTHROSCOPY Left approx 1977    Social History   Socioeconomic History   Marital status: Single    Spouse name: Not on file   Number of children: Not on file   Years of education: Not on file   Highest education level: Not on file  Occupational History   Not on file  Tobacco Use   Smoking status: Never   Smokeless tobacco: Never  Vaping Use   Vaping Use: Never used  Substance and Sexual Activity   Alcohol use: Yes    Alcohol/week: 29.0 standard drinks of alcohol    Types: 21 Cans of beer, 8 Shots of liquor per week    Comment: a pint of bourbon on the weekends.   Drug use: Not Currently    Types: Marijuana    Comment: stopped in 1995   Sexual activity: Not on file  Other Topics Concern   Not on file  Social History Narrative   Not on file   Social Determinants of Health   Financial Resource Strain: Not on file  Food Insecurity: Not on file  Transportation Needs: Not on file  Physical Activity: Not on file  Stress: Not on file  Social Connections: Not on file  Intimate Partner Violence: Not  on file     No Known Allergies   Outpatient Medications Prior to Visit  Medication Sig Dispense Refill   metoprolol tartrate (LOPRESSOR) 50 MG tablet Take 1 tablet (50 mg total) by mouth once for 1 dose. PLEASE TAKE METOPROLOL 2  HOURS PRIOR TO CTA SCAN. 1 tablet 0   PROAIR HFA 108 (90 Base) MCG/ACT inhaler Inhale 1-2 puffs into the lungs every 4 (four) hours as needed for wheezing or shortness of breath.  0   No facility-administered medications prior to visit.       Objective:   Physical Exam:  General appearance: 56 y.o., male, NAD, conversant  Eyes: anicteric sclerae; PERRL, tracking appropriately HENT: NCAT; MMM Neck: Trachea midline; no lymphadenopathy, no JVD Lungs: CTAB, no crackles, no wheeze, with normal respiratory effort CV: RRR, no murmur  Abdomen: Soft, non-tender; non-distended, BS present  Extremities: No peripheral edema, warm Skin: Normal turgor and texture; no rash Psych: Appropriate affect Neuro: Alert and oriented to person and place, no focal deficit     There were no vitals filed for this visit.   on *** LPM *** RA BMI Readings from Last 3 Encounters:  03/31/22 31.82 kg/m  02/09/22 31.72 kg/m  01/11/18 32.47 kg/m   Wt  Readings from Last 3 Encounters:  03/31/22 234 lb 9.6 oz (106.4 kg)  02/09/22 233 lb 14.4 oz (106.1 kg)  01/11/18 239 lb 6.4 oz (108.6 kg)     CBC    Component Value Date/Time   WBC 7.0 02/09/2022 1555   RBC 4.74 02/09/2022 1555   HGB 13.7 02/09/2022 1555   HCT 43.2 02/09/2022 1555   PLT 226 02/09/2022 1555   MCV 91.1 02/09/2022 1555   MCH 28.9 02/09/2022 1555   MCHC 31.7 02/09/2022 1555   RDW 14.3 02/09/2022 1555   LYMPHSABS 1.6 08/22/2010 1756   MONOABS 0.8 08/22/2010 1756   EOSABS 0.1 08/22/2010 1756   BASOSABS 0.0 08/22/2010 1756    ***  Chest Imaging: CXR 09/16/21 reviewed by me unremarkable  Pulmonary Functions Testing Results:     No data to display          FeNO: ***  Pathology:  ***  Echocardiogram: ***  Heart Catheterization: ***    Assessment & Plan:    Plan:      Omar Person, MD  Pulmonary Critical Care 05/11/2022 3:04 PM

## 2022-05-12 ENCOUNTER — Ambulatory Visit (INDEPENDENT_AMBULATORY_CARE_PROVIDER_SITE_OTHER): Payer: BC Managed Care – PPO | Admitting: Student

## 2022-05-12 ENCOUNTER — Encounter: Payer: Self-pay | Admitting: Student

## 2022-05-12 ENCOUNTER — Telehealth (HOSPITAL_COMMUNITY): Payer: Self-pay | Admitting: *Deleted

## 2022-05-12 VITALS — BP 132/80 | HR 80 | Temp 98.6°F | Ht 72.0 in | Wt 236.2 lb

## 2022-05-12 DIAGNOSIS — R053 Chronic cough: Secondary | ICD-10-CM

## 2022-05-12 DIAGNOSIS — R0609 Other forms of dyspnea: Secondary | ICD-10-CM

## 2022-05-12 MED ORDER — PANTOPRAZOLE SODIUM 40 MG PO TBEC: 40.0000 mg | DELAYED_RELEASE_TABLET | Freq: Every day | ORAL | 0 refills | Status: DC

## 2022-05-12 NOTE — Telephone Encounter (Signed)
Reaching out to patient to offer assistance regarding upcoming cardiac imaging study; pt verbalizes understanding of appt date/time, parking situation and where to check in, pre-test NPO status and medications ordered, and verified current allergies; name and call back number provided for further questions should they arise  Fady Stamps RN Navigator Cardiac Imaging [facility] [phone] office [phone] cell  Patient to take 50mg metoprolol tartrate two hours prior to his cardiac CT scan. He is aware to arrive at 8:30am. 

## 2022-05-12 NOTE — Patient Instructions (Addendum)
- take protonix 40 mg daily 30 minutes before breakfast and see instructions below to limit reflux - we will schedule PFTs - try to hold off on advair before breathing tests as much as possible - see you in 4 weeks   Gastroesophageal Reflux Disease, Adult Gastroesophageal reflux (GER) happens when acid from the stomach flows up into the tube that connects the mouth and the stomach (esophagus). Normally, food travels down the esophagus and stays in the stomach to be digested. However, when a person has GER, food and stomach acid sometimes move back up into the esophagus. If this becomes a more serious problem, the person may be diagnosed with a disease called gastroesophageal reflux disease (GERD). GERD occurs when the reflux: Happens often. Causes frequent or severe symptoms. Causes problems such as damage to the esophagus. When stomach acid comes in contact with the esophagus, the acid may cause inflammation in the esophagus. Over time, GERD may create small holes (ulcers) in the lining of the esophagus. What are the causes? This condition is caused by a problem with the muscle between the esophagus and the stomach (lower esophageal sphincter, or LES). Normally, the LES muscle closes after food passes through the esophagus to the stomach. When the LES is weakened or abnormal, it does not close properly, and that allows food and stomach acid to go back up into the esophagus. The LES can be weakened by certain dietary substances, medicines, and medical conditions, including: Tobacco use. Pregnancy. Having a hiatal hernia. Alcohol use. Certain foods and beverages, such as coffee, chocolate, onions, and peppermint. What increases the risk? You are more likely to develop this condition if you: Have an increased body weight. Have a connective tissue disorder. Take NSAIDs, such as ibuprofen. What are the signs or symptoms? Symptoms of this condition include: Heartburn. Difficult or painful  swallowing and the feeling of having a lump in the throat. A bitter taste in the mouth. Bad breath and having a large amount of saliva. Having an upset or bloated stomach and belching. Chest pain. Different conditions can cause chest pain. Make sure you see your health care provider if you experience chest pain. Shortness of breath or wheezing. Ongoing (chronic) cough or a nighttime cough. Wearing away of tooth enamel. Weight loss. How is this diagnosed? This condition may be diagnosed based on a medical history and a physical exam. To determine if you have mild or severe GERD, your health care provider may also monitor how you respond to treatment. You may also have tests, including: A test to examine your stomach and esophagus with a small camera (endoscopy). A test that measures the acidity level in your esophagus. A test that measures how much pressure is on your esophagus. A barium swallow or modified barium swallow test to show the shape, size, and functioning of your esophagus. How is this treated? Treatment for this condition may vary depending on how severe your symptoms are. Your health care provider may recommend: Changes to your diet. Medicine. Surgery. The goal of treatment is to help relieve your symptoms and to prevent complications. Follow these instructions at home: Eating and drinking  Follow a diet as recommended by your health care provider. This may involve avoiding foods and drinks such as: Coffee and tea, with or without caffeine. Drinks that contain alcohol. Energy drinks and sports drinks. Carbonated drinks or sodas. Chocolate and cocoa. Peppermint and mint flavorings. Garlic and onions. Horseradish. Spicy and acidic foods, including peppers, chili powder, curry powder, vinegar,  hot sauces, and barbecue sauce. Citrus fruit juices and citrus fruits, such as oranges, lemons, and limes. Tomato-based foods, such as red sauce, chili, salsa, and pizza with red  sauce. Fried and fatty foods, such as donuts, french fries, potato chips, and high-fat dressings. High-fat meats, such as hot dogs and fatty cuts of red and white meats, such as rib eye steak, sausage, ham, and bacon. High-fat dairy items, such as whole milk, butter, and cream cheese. Eat small, frequent meals instead of large meals. Avoid drinking large amounts of liquid with your meals. Avoid eating meals during the 2-3 hours before bedtime. Avoid lying down right after you eat. Do not exercise right after you eat. Lifestyle  Do not use any products that contain nicotine or tobacco. These products include cigarettes, chewing tobacco, and vaping devices, such as e-cigarettes. If you need help quitting, ask your health care provider. Try to reduce your stress by using methods such as yoga or meditation. If you need help reducing stress, ask your health care provider. If you are overweight, reduce your weight to an amount that is healthy for you. Ask your health care provider for guidance about a safe weight loss goal. General instructions Pay attention to any changes in your symptoms. Take over-the-counter and prescription medicines only as told by your health care provider. Do not take aspirin, ibuprofen, or other NSAIDs unless your health care provider told you to take these medicines. Wear loose-fitting clothing. Do not wear anything tight around your waist that causes pressure on your abdomen. Raise (elevate) the head of your bed about 6 inches (15 cm). You can use a wedge to do this. Avoid bending over if this makes your symptoms worse. Keep all follow-up visits. This is important. Contact a health care provider if: You have: New symptoms. Unexplained weight loss. Difficulty swallowing or it hurts to swallow. Wheezing or a persistent cough. A hoarse voice. Your symptoms do not improve with treatment. Get help right away if: You have sudden pain in your arms, neck, jaw, teeth, or  back. You suddenly feel sweaty, dizzy, or light-headed. You have chest pain or shortness of breath. You vomit and the vomit is green, yellow, or black, or it looks like blood or coffee grounds. You faint. You have stool that is red, bloody, or black. You cannot swallow, drink, or eat. These symptoms may represent a serious problem that is an emergency. Do not wait to see if the symptoms will go away. Get medical help right away. Call your local emergency services (911 in the [place] Do not drive yourself to the hospital. Summary Gastroesophageal reflux happens when acid from the stomach flows up into the esophagus. GERD is a disease in which the reflux happens often, causes frequent or severe symptoms, or causes problems such as damage to the esophagus. Treatment for this condition may vary depending on how severe your symptoms are. Your health care provider may recommend diet and lifestyle changes, medicine, or surgery. Contact a health care provider if you have new or worsening symptoms. Take over-the-counter and prescription medicines only as told by your health care provider. Do not take aspirin, ibuprofen, or other NSAIDs unless your health care provider told you to do so. Keep all follow-up visits as told by your health care provider. This is important. This information is not intended to replace advice given to you by your health care provider. Make sure you discuss any questions you have with your health care provider. Document Revised: 04/15/2020 Document  Reviewed: 04/15/2020 Elsevier Patient Education  2023 [employer].

## 2022-05-13 ENCOUNTER — Ambulatory Visit (HOSPITAL_COMMUNITY)
Admission: RE | Admit: 2022-05-13 | Discharge: 2022-05-13 | Disposition: A | Discharge status: Home / Self Care | Payer: BC Managed Care – PPO | Source: Ambulatory Visit | Attending: Internal Medicine | Admitting: Internal Medicine

## 2022-05-13 DIAGNOSIS — R072 Precordial pain: Secondary | ICD-10-CM | POA: Insufficient documentation

## 2022-05-13 MED ORDER — NITROGLYCERIN 0.4 MG SL SUBL
0.8000 mg | SUBLINGUAL_TABLET | Freq: Once | SUBLINGUAL | Status: AC
  Administered 2022-05-13: 0.8 mg via SUBLINGUAL

## 2022-05-13 MED ORDER — IOHEXOL 350 MG/ML SOLN
100.0000 mL | Freq: Once | INTRAVENOUS | Status: AC | PRN
  Administered 2022-05-13: 100 mL via INTRAVENOUS

## 2022-05-13 MED ORDER — NITROGLYCERIN 0.4 MG SL SUBL
SUBLINGUAL_TABLET | SUBLINGUAL | Status: AC
  Filled 2022-05-13: qty 2

## 2022-06-09 ENCOUNTER — Ambulatory Visit (INDEPENDENT_AMBULATORY_CARE_PROVIDER_SITE_OTHER): Payer: BC Managed Care – PPO | Admitting: Student

## 2022-06-09 DIAGNOSIS — R0609 Other forms of dyspnea: Secondary | ICD-10-CM | POA: Diagnosis not present

## 2022-06-09 LAB — PULMONARY FUNCTION TEST
DL/VA % pred: 131 %
DL/VA: 5.6 ml/min/mmHg/L
DLCO cor % pred: 84 %
DLCO cor: 25.37 ml/min/mmHg
DLCO unc % pred: 84 %
DLCO unc: 25.37 ml/min/mmHg
FEF 25-75 Post: 1.29 L/sec
FEF 25-75 Pre: 0.86 L/sec
FEF2575-%Change-Post: 50 %
FEF2575-%Pred-Post: 38 %
FEF2575-%Pred-Pre: 25 %
FEV1-%Change-Post: 19 %
FEV1-%Pred-Post: 46 %
FEV1-%Pred-Pre: 38 %
FEV1-Post: 1.85 L
FEV1-Pre: 1.55 L
FEV1FVC-%Change-Post: 6 %
FEV1FVC-%Pred-Pre: 77 %
FEV6-%Change-Post: 12 %
FEV6-%Pred-Post: 58 %
FEV6-%Pred-Pre: 51 %
FEV6-Post: 2.92 L
FEV6-Pre: 2.59 L
FEV6FVC-%Change-Post: 0 %
FEV6FVC-%Pred-Post: 104 %
FEV6FVC-%Pred-Pre: 104 %
FVC-%Change-Post: 11 %
FVC-%Pred-Post: 55 %
FVC-%Pred-Pre: 50 %
FVC-Post: 2.92 L
FVC-Pre: 2.62 L
Post FEV1/FVC ratio: 63 %
Post FEV6/FVC ratio: 100 %
Pre FEV1/FVC ratio: 59 %
Pre FEV6/FVC Ratio: 100 %
RV % pred: 125 %
RV: 2.84 L
TLC % pred: 82 %
TLC: 6.11 L

## 2022-06-09 NOTE — Patient Instructions (Signed)
Full PFT performed today. °

## 2022-06-10 ENCOUNTER — Ambulatory Visit: Payer: BC Managed Care – PPO | Admitting: Student

## 2022-06-23 NOTE — Progress Notes (Unsigned)
Synopsis: Referred for cough by Farris Has, MD  Subjective:   PATIENT ID: Curtis Greene GENDER: male DOB: Dec 03, 1965, MRN: [id number]  No chief complaint on file.  56yM with history of referred for cough, seen in past by Dr. 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  He has had cough - typically not productive but occasionally has clear sputum, wheezing, dyspnea over the last year. He is not sure if he's tried steroids before. Some relief with inhalers but not a ton. He says he has never tried medicine for acid reflux before. Does have some heartburn it sounds like but no dysphagia. He had some sinus congestion/pnd during the worst of pollen season but has cleared up some.   Scheduled for CT coronaries   Father died of lung cancer, was a smoker  Worked in various jobs with exposures to different chemicals. 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 Didn't wear a mask when he was working.   Interval HPI:  PFT with severe obstruciton, BD response, air trapping  CT lung bases unremarkable, CT coronaries unremarkable  Otherwise pertinent review of systems is negative.  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     Family History  Problem Relation Age of Onset   Lung cancer Father        smoked     Past Surgical History:  Procedure Laterality Date   INCISION AND DRAINAGE ABSCESS Left 03/19/2014   Procedure: INCISION AND DRAINAGE ABSCESS;  Surgeon: Robyne Askew, MD;  Location: [facility];  Service: General;  Laterality: Left;   KNEE ARTHROSCOPY Left approx 1977    Social History   Socioeconomic History   Marital status: Single    Spouse name: Not on file   Number of children: Not on file   Years of education: Not on file   Highest education level: Not on file  Occupational History   Not on file  Tobacco Use    Smoking status: Never   Smokeless tobacco: Never  Vaping Use   Vaping Use: Never used  Substance and Sexual Activity   Alcohol use: Yes    Alcohol/week: 29.0 standard drinks of alcohol    Types: 21 Cans of beer, 8 Shots of liquor per week    Comment: a pint of bourbon on the weekends.   Drug use: Not Currently    Types: Marijuana    Comment: stopped in 1995   Sexual activity: Not on file  Other Topics Concern   Not on file  Social History Narrative   Not on file   Social Determinants of Health   Financial Resource Strain: Not on file  Food Insecurity: Not on file  Transportation Needs: Not on file  Physical Activity: Not on file  Stress: Not on file  Social Connections: Not on file  Intimate Partner Violence: Not on file     No Known Allergies   Outpatient Medications Prior to Visit  Medication Sig Dispense Refill   fluticasone-salmeterol (ADVAIR DISKUS) 250-50 MCG/ACT AEPB 1 puff daily as needed     pantoprazole (PROTONIX) 40 MG tablet Take 1 tablet (40 mg total) by mouth daily. 90 tablet 0   PROAIR HFA 108 (90 Base) MCG/ACT inhaler Inhale 1-2 puffs into the lungs every 4 (four) hours as needed for wheezing or shortness of breath.  0  No facility-administered medications prior to visit.       Objective:   Physical Exam:  General appearance: 56 y.o., male, NAD, conversant  Eyes: anicteric sclerae; PERRL, tracking appropriately HENT: NCAT; MMM Neck: Trachea midline; no lymphadenopathy, no JVD Lungs: faint wheeze bl, with normal respiratory effort CV: RRR, no murmur  Abdomen: Soft, non-tender; non-distended, BS present  Extremities: No peripheral edema, warm Skin: Normal turgor and texture; no rash Psych: Appropriate affect Neuro: Alert and oriented to person and place, no focal deficit     There were no vitals filed for this visit.    on RA BMI Readings from Last 3 Encounters:  05/12/22 32.03 kg/m  03/31/22 31.82 kg/m  02/09/22 31.72 kg/m   Wt  Readings from Last 3 Encounters:  05/12/22 236 lb 3.2 oz (107.1 kg)  03/31/22 234 lb 9.6 oz (106.4 kg)  02/09/22 233 lb 14.4 oz (106.1 kg)     CBC    Component Value Date/Time   WBC 7.0 02/09/2022 1555   RBC 4.74 02/09/2022 1555   HGB 13.7 02/09/2022 1555   HCT 43.2 02/09/2022 1555   PLT 226 02/09/2022 1555   MCV 91.1 02/09/2022 1555   MCH 28.9 02/09/2022 1555   MCHC 31.7 02/09/2022 1555   RDW 14.3 02/09/2022 1555   LYMPHSABS 1.6 08/22/2010 1756   MONOABS 0.8 08/22/2010 1756   EOSABS 0.1 08/22/2010 1756   BASOSABS 0.0 08/22/2010 1756     Chest Imaging: CXR 09/16/21 reviewed by me unremarkable  Pulmonary Functions Testing Results:    Latest Ref Rng & Units 06/09/2022    3:36 PM  PFT Results  FVC-Pre L 2.62   FVC-Predicted Pre % 50   FVC-Post L 2.92   FVC-Predicted Post % 55   Pre FEV1/FVC % % 59   Post FEV1/FCV % % 63   FEV1-Pre L 1.55   FEV1-Predicted Pre % 38   FEV1-Post L 1.85   DLCO uncorrected ml/min/mmHg 25.37   DLCO UNC% % 84   DLCO corrected ml/min/mmHg 25.37   DLCO COR %Predicted % 84   DLVA Predicted % 131   TLC L 6.11   TLC % Predicted % 82   RV % Predicted % 125    Severe obstruction, BD response, air trapping    Assessment & Plan:   # DOE # Chronic cough Reflux probably contributing in some way to these, especially cough. DOE could be manifestation of asthma, covid-19 related small airways disease, reactive airways disease related to occupational exposures, particulate or covid-19 related ILD among other possibilities.  Plan: - PFTs next available, will lay off advair as much as possible before PFT  - has CT Coronaries tomorrow, await PFTs to determine if need any other imaging - start protonix 40 mg daily 30 min before breakfast, GERD TLC measures discussed - RTC 4 weeks to discuss      Omar Person, MD [facility] 06/23/2022 12:50 PM

## 2022-06-24 ENCOUNTER — Encounter: Payer: Self-pay | Admitting: Student

## 2022-06-24 ENCOUNTER — Ambulatory Visit (INDEPENDENT_AMBULATORY_CARE_PROVIDER_SITE_OTHER): Payer: BC Managed Care – PPO | Admitting: Student

## 2022-06-24 VITALS — BP 132/80 | HR 79 | Temp 98.2°F | Ht 72.0 in | Wt 233.0 lb

## 2022-06-24 DIAGNOSIS — R053 Chronic cough: Secondary | ICD-10-CM

## 2022-06-24 DIAGNOSIS — J449 Chronic obstructive pulmonary disease, unspecified: Secondary | ICD-10-CM | POA: Diagnosis not present

## 2022-06-24 NOTE — Patient Instructions (Addendum)
-   You have severe obstructive lung disease - COPD - Start breztri 2 puffs twice daily, rinse mouth, gargle after use. You may find it helpful to use this with a spacer which I've prescribed (can also purchase through [facility] for about $10-20). Look up video online for ideal use with spacer. Need to rinse spacer after each use.  - See you in 3 months or sooner if need be!

## 2022-07-30 ENCOUNTER — Telehealth: Payer: Self-pay | Admitting: Student

## 2022-07-30 NOTE — Telephone Encounter (Signed)
Called and spoke with pt letting him know the info from Dr. Verlee Monte and he verbalized understanding. Pt requested to have info sent to him in [place] message so have sent message to pt. Nothing further needed.
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
## 2022-07-30 NOTE — Telephone Encounter (Signed)
Called and spoke with pt who states at his place of residence there is white mold. Pt wants to know what might be recommended due to this. Dr. Verlee Monte, please advise.

## 2022-08-13 ENCOUNTER — Telehealth: Payer: Self-pay | Admitting: Student

## 2022-08-14 MED ORDER — BREZTRI AEROSPHERE 160-9-4.8 MCG/ACT IN AERO
2.0000 | INHALATION_SPRAY | Freq: Two times a day (BID) | RESPIRATORY_TRACT | 0 refills | Status: DC
Start: 2022-08-14 — End: 2022-09-14

## 2022-08-14 NOTE — Telephone Encounter (Signed)
Called and spoke with patient. He wanted to know the name of the inhaler he was given a sample of at his OV. 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 He requested to have another sample and RX sent to his pharmacy. I advised him I would leave 2 samples and coupon card at the front desk for him and send the RX to [facility]. He verbalized understanding.   Nothing further needed at time of call.

## 2022-08-31 ENCOUNTER — Encounter: Payer: Self-pay | Admitting: Student

## 2022-09-01 NOTE — Telephone Encounter (Signed)
Sent mychart message

## 2022-09-14 ENCOUNTER — Telehealth: Payer: Self-pay | Admitting: Student
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
## 2022-09-14 MED ORDER — BREZTRI AEROSPHERE 160-9-4.8 MCG/ACT IN AERO
2.0000 | INHALATION_SPRAY | Freq: Two times a day (BID) | RESPIRATORY_TRACT | 6 refills | Status: DC
Start: 2022-09-14 — End: 2023-03-03

## 2022-09-14 NOTE — Telephone Encounter (Signed)
Called patient but he did not answer. Left message for him to call back. Medication has been sent to his pharmacy as requested.

## 2022-09-27 NOTE — Progress Notes (Unsigned)
Synopsis: Referred for cough by Farris Has, MD  Subjective:   PATIENT ID: Curtis Greene GENDER: male DOB: Oct 12, 1966, MRN: [id number]  No chief complaint on file.  55yM with history of referred for cough, seen in past by Dr. 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  He has had cough - typically not productive but occasionally has clear sputum, wheezing, dyspnea over the last year. He is not sure if he's tried steroids before. Some relief with inhalers but not a ton. He says he has never tried medicine for acid reflux before. Does have some heartburn it sounds like but no dysphagia. He had some sinus congestion/pnd during the worst of pollen season but has cleared up some.   Scheduled for CT coronaries   Father died of lung cancer, was a smoker  Worked in various jobs with exposures to different chemicals. 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 Didn't wear a mask when he was working.   Interval HPI:  PFT with severe obstruciton, BD response, air trapping.  CT lung bases unremarkable, CT coronaries unremarkable  No courses of prednisone or ABX since last visit. Cough is about the same as at last visit. DOE is about the same as at last visit. He has however applied for disability due to herniated disks.  ----------------------- Continued on breztri  Had some mold exposure at home that he was concerned about and sent some pics through my chart  Otherwise pertinent review of systems is negative.  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     Family History  Problem Relation Age of Onset   Lung cancer Father        smoked     Past Surgical History:  Procedure Laterality Date   INCISION AND DRAINAGE ABSCESS Left 03/19/2014   Procedure: INCISION AND DRAINAGE ABSCESS;  Surgeon: Robyne Askew, MD;  Location: [facility];   Service: General;  Laterality: Left;   KNEE ARTHROSCOPY Left approx 1977    Social History   Socioeconomic History   Marital status: Single    Spouse name: Not on file   Number of children: Not on file   Years of education: Not on file   Highest education level: Not on file  Occupational History   Not on file  Tobacco Use   Smoking status: Never   Smokeless tobacco: Never  Vaping Use   Vaping Use: Never used  Substance and Sexual Activity   Alcohol use: Yes    Alcohol/week: 29.0 standard drinks of alcohol    Types: 21 Cans of beer, 8 Shots of liquor per week    Comment: a pint of bourbon on the weekends.   Drug use: Not Currently    Types: Marijuana    Comment: stopped in 1995   Sexual activity: Not on file  Other Topics Concern   Not on file  Social History Narrative   Not on file   Social Determinants of Health   Financial Resource Strain: Not on file  Food Insecurity: Not on file  Transportation Needs: Not on file  Physical Activity: Not on file  Stress: Not on file  Social Connections: Not on file  Intimate Partner Violence: Not on file     No Known Allergies   Outpatient Medications Prior to Visit  Medication Sig Dispense Refill   Budeson-Glycopyrrol-Formoterol (  BREZTRI AEROSPHERE) 160-9-4.8 MCG/ACT AERO Inhale 2 puffs into the lungs in the morning and at bedtime. 10.7 g 6   pantoprazole (PROTONIX) 40 MG tablet Take 1 tablet (40 mg total) by mouth daily. 90 tablet 0   PROAIR HFA 108 (90 Base) MCG/ACT inhaler Inhale 1-2 puffs into the lungs every 4 (four) hours as needed for wheezing or shortness of breath.  0   No facility-administered medications prior to visit.       Objective:   Physical Exam:  General appearance: 56 y.o., male, NAD, conversant  Eyes: anicteric sclerae; PERRL, tracking appropriately HENT: NCAT; MMM Neck: Trachea midline; no lymphadenopathy, no JVD Lungs: diminished bl, with normal respiratory effort CV: RRR, no murmur   Abdomen: Soft, non-tender; non-distended, BS present  Extremities: No peripheral edema, warm Skin: Normal turgor and texture; no rash Psych: Appropriate affect Neuro: Alert and oriented to person and place, no focal deficit     There were no vitals filed for this visit.     on RA BMI Readings from Last 3 Encounters:  06/24/22 31.60 kg/m  05/12/22 32.03 kg/m  03/31/22 31.82 kg/m   Wt Readings from Last 3 Encounters:  06/24/22 233 lb (105.7 kg)  05/12/22 236 lb 3.2 oz (107.1 kg)  03/31/22 234 lb 9.6 oz (106.4 kg)     CBC    Component Value Date/Time   WBC 7.0 02/09/2022 1555   RBC 4.74 02/09/2022 1555   HGB 13.7 02/09/2022 1555   HCT 43.2 02/09/2022 1555   PLT 226 02/09/2022 1555   MCV 91.1 02/09/2022 1555   MCH 28.9 02/09/2022 1555   MCHC 31.7 02/09/2022 1555   RDW 14.3 02/09/2022 1555   LYMPHSABS 1.6 08/22/2010 1756   MONOABS 0.8 08/22/2010 1756   EOSABS 0.1 08/22/2010 1756   BASOSABS 0.0 08/22/2010 1756     Chest Imaging: CXR 09/16/21 reviewed by me unremarkable  CT Coronaries 05/13/22 reviewed by me with bronchial wall thickening  Pulmonary Functions Testing Results:    Latest Ref Rng & Units 06/09/2022    3:36 PM  PFT Results  FVC-Pre L 2.62   FVC-Predicted Pre % 50   FVC-Post L 2.92   FVC-Predicted Post % 55   Pre FEV1/FVC % % 59   Post FEV1/FCV % % 63   FEV1-Pre L 1.55   FEV1-Predicted Pre % 38   FEV1-Post L 1.85   DLCO uncorrected ml/min/mmHg 25.37   DLCO UNC% % 84   DLCO corrected ml/min/mmHg 25.37   DLCO COR %Predicted % 84   DLVA Predicted % 131   TLC L 6.11   TLC % Predicted % 82   RV % Predicted % 125    Severe obstruction, BD response, air trapping    Assessment & Plan:   # DOE # COPD gold B, ACOS # Severe obstruction with BD response, air trapping Reflux probably contributing in some way to these, especially cough. Suspect asthma-COPD overlap main contributor however which could be related to secondhand, workplace  exposures.  Plan: - Start breztri 2 puffs twice daily, rinse mouth, gargle after use. You may find it helpful to use this with a spacer which I've prescribed (can also purchase through [facility] for about $10-20). Look up video online for ideal use with spacer. Need to rinse spacer after each use. Technique demonstrated.  - continue protonix 40 mg daily 30 min before breakfast, GERD TLC measures discussed - RTC 3 months     Omar Person, MD  Pulmonary Critical  Care 09/27/2022 3:56 PM

## 2022-09-29 ENCOUNTER — Encounter: Payer: Self-pay | Admitting: Student

## 2022-09-29 ENCOUNTER — Ambulatory Visit (INDEPENDENT_AMBULATORY_CARE_PROVIDER_SITE_OTHER): Payer: 59 | Admitting: Student

## 2022-09-29 VITALS — BP 124/78 | HR 65 | Temp 97.8°F | Ht 71.5 in | Wt 241.0 lb

## 2022-09-29 DIAGNOSIS — J4489 Other specified chronic obstructive pulmonary disease: Secondary | ICD-10-CM | POA: Diagnosis not present

## 2022-09-29 DIAGNOSIS — R0609 Other forms of dyspnea: Secondary | ICD-10-CM

## 2022-09-29 LAB — CBC WITH DIFFERENTIAL/PLATELET
Basophils Absolute: 0 10*3/uL (ref 0.0–0.1)
Basophils Relative: 0.9 % (ref 0.0–3.0)
Eosinophils Absolute: 0.1 10*3/uL (ref 0.0–0.7)
Eosinophils Relative: 2.7 % (ref 0.0–5.0)
HCT: 43.9 % (ref 39.0–52.0)
Hemoglobin: 14.6 g/dL (ref 13.0–17.0)
Lymphocytes Relative: 28 % (ref 12.0–46.0)
Lymphs Abs: 1.4 10*3/uL (ref 0.7–4.0)
MCHC: 33.1 g/dL (ref 30.0–36.0)
MCV: 90.3 fl (ref 78.0–100.0)
Monocytes Absolute: 0.5 10*3/uL (ref 0.1–1.0)
Monocytes Relative: 9.4 % (ref 3.0–12.0)
Neutro Abs: 3 10*3/uL (ref 1.4–7.7)
Neutrophils Relative %: 59 % (ref 43.0–77.0)
Platelets: 263 10*3/uL (ref 150.0–400.0)
RBC: 4.86 Mil/uL (ref 4.22–5.81)
RDW: 14.2 % (ref 11.5–15.5)
WBC: 5.1 10*3/uL (ref 4.0–10.5)

## 2022-09-29 MED ORDER — ALBUTEROL SULFATE HFA 108 (90 BASE) MCG/ACT IN AERS: 2.0000 | INHALATION_SPRAY | Freq: Four times a day (QID) | RESPIRATORY_TRACT | 11 refills | Status: DC | PRN

## 2022-09-29 NOTE — Patient Instructions (Signed)
-   labs today - Continue breztri 2 puffs twice daily, rinse mouth, gargle after use. You may find it helpful to use this with a spacer which I've prescribed (can also purchase through [facility] for about $10-20). Look up video online for ideal use with spacer. Need to rinse spacer after each use.  - albuterol 1-2 puffs every 4-6 hours as needed this is a RESCUE inhaler - See you in 6 months or sooner if need be!

## 2022-10-01 LAB — RESPIRATORY ALLERGY PROFILE REGION II ~~LOC~~
Allergen, A. alternata, m6: 5.85 kU/L — ABNORMAL HIGH
Allergen, Cedar tree, t12: <0.1 kU/L
Allergen, Comm Silver Birch, t9: <0.1 kU/L
Allergen, Cottonwood, t14: <0.1 kU/L
Allergen, D pternoyssinus,d7: 0.18 kU/L — ABNORMAL HIGH
Allergen, Mouse Urine Protein, e78: <0.1 kU/L
Allergen, Mulberry, t76: <0.1 kU/L
Allergen, Oak,t7: <0.1 kU/L
Allergen, P. notatum, m1: <0.1 kU/L
Aspergillus fumigatus, m3: <0.1 kU/L
Bermuda Grass: <0.1 kU/L
Box Elder IgE: <0.1 kU/L
CLADOSPORIUM HERBARUM (M2) IGE: <0.1 kU/L
COMMON RAGWEED (SHORT) (W1) IGE: <0.1 kU/L
Cat Dander: <0.1 kU/L
Class: 0
Class: 0
Class: 0
Class: 0
Class: 0
Class: 0
Class: 0
Class: 0
Class: 0
Class: 0
Class: 0
Class: 0
Class: 0
Class: 0
Class: 0
Class: 0
Class: 0
Class: 0
Class: 0
Class: 0
Class: 1
Class: 3
Cockroach: 0.36 kU/L — ABNORMAL HIGH
D. farinae: 0.26 kU/L — ABNORMAL HIGH
Dog Dander: <0.1 kU/L
Elm IgE: <0.1 kU/L
IgE (Immunoglobulin E), Serum: 32 kU/L (ref <=114)
Johnson Grass: <0.1 kU/L
Pecan/Hickory Tree IgE: <0.1 kU/L
Rough Pigweed  IgE: <0.1 kU/L
Sheep Sorrel IgE: <0.1 kU/L
Timothy Grass: <0.1 kU/L

## 2022-10-01 LAB — INTERPRETATION:

## 2022-10-05 LAB — HYPERSENSITIVITY PNEUMONITIS
A. Pullulans Abs: NEGATIVE
A.Fumigatus #1 Abs: NEGATIVE
Micropolyspora faeni, IgG: NEGATIVE
Pigeon Serum Abs: NEGATIVE
Thermoact. Saccharii: NEGATIVE
Thermoactinomyces vulgaris, IgG: NEGATIVE

## 2023-02-02 ENCOUNTER — Other Ambulatory Visit: Payer: Self-pay | Admitting: Neurosurgery

## 2023-02-03 ENCOUNTER — Encounter (HOSPITAL_COMMUNITY): Payer: Self-pay | Admitting: Neurosurgery

## 2023-02-03 ENCOUNTER — Other Ambulatory Visit: Payer: Self-pay

## 2023-02-03 NOTE — Progress Notes (Signed)
SDW CALL  Patient was given pre-op instructions over the phone. Patient verbalized understanding of instructions provided.   PCP - Dr. Farris Has, [facility] Cardiologist - denies Pulmonary: Dr. Thora Lance  Chest x-ray - 09/16/21 EKG - n/a Stress Test - denies ECHO - denies Cardiac Cath - denies  ERAS Protcol - clear liquids until 11:30 PRE-SURGERY Ensure or G2-   COVID TEST- n/a   Anesthesia review: no  Patient denies shortness of breath, fever, cough and chest pain over the phone call  Pt requested a call back since he did not have a pen to write down surgical instructions. Call returned, detailed VM left with DOS instructions provided.

## 2023-02-05 ENCOUNTER — Other Ambulatory Visit: Payer: Self-pay

## 2023-02-05 ENCOUNTER — Ambulatory Visit (HOSPITAL_COMMUNITY)
Admission: RE | Admit: 2023-02-05 | Discharge: 2023-02-06 | Disposition: A | Discharge status: Home / Self Care | Payer: 59 | Attending: Neurosurgery | Admitting: Neurosurgery

## 2023-02-05 ENCOUNTER — Ambulatory Visit (HOSPITAL_COMMUNITY)
Admission: RE | Disposition: A | Discharge status: Home / Self Care | Payer: Self-pay | Source: Home / Self Care | Attending: Neurosurgery

## 2023-02-05 ENCOUNTER — Encounter (HOSPITAL_COMMUNITY): Payer: Self-pay | Admitting: Neurosurgery

## 2023-02-05 ENCOUNTER — Ambulatory Visit (HOSPITAL_BASED_OUTPATIENT_CLINIC_OR_DEPARTMENT_OTHER): Payer: 59

## 2023-02-05 ENCOUNTER — Ambulatory Visit (HOSPITAL_COMMUNITY): Payer: 59

## 2023-02-05 DIAGNOSIS — M5126 Other intervertebral disc displacement, lumbar region: Secondary | ICD-10-CM | POA: Diagnosis present

## 2023-02-05 DIAGNOSIS — J449 Chronic obstructive pulmonary disease, unspecified: Secondary | ICD-10-CM | POA: Insufficient documentation

## 2023-02-05 HISTORY — DX: Chronic obstructive pulmonary disease, unspecified: J44.9

## 2023-02-05 HISTORY — PX: LUMBAR LAMINECTOMY/DECOMPRESSION MICRODISCECTOMY: SHX5026

## 2023-02-05 LAB — COMPREHENSIVE METABOLIC PANEL
ALT: 20 U/L (ref 0–44)
AST: 19 U/L (ref 15–41)
Albumin: 3.8 g/dL (ref 3.5–5.0)
Alkaline Phosphatase: 56 U/L (ref 38–126)
Anion gap: 10 (ref 5–15)
BUN: 12 mg/dL (ref 6–20)
CO2: 23 mmol/L (ref 22–32)
Calcium: 9.2 mg/dL (ref 8.9–10.3)
Chloride: 103 mmol/L (ref 98–111)
Creatinine, Ser: 1.09 mg/dL (ref 0.61–1.24)
GFR, Estimated: >60 mL/min (ref >60)
Glucose, Bld: 91 mg/dL (ref 70–99)
Potassium: 3.8 mmol/L (ref 3.5–5.1)
Sodium: 136 mmol/L (ref 135–145)
Total Bilirubin: 0.8 mg/dL (ref 0.3–1.2)
Total Protein: 7.1 g/dL (ref 6.5–8.1)

## 2023-02-05 LAB — CBC
HCT: 45.9 % (ref 39.0–52.0)
Hemoglobin: 15 g/dL (ref 13.0–17.0)
MCH: 29.9 pg (ref 26.0–34.0)
MCHC: 32.7 g/dL (ref 30.0–36.0)
MCV: 91.4 fL (ref 80.0–100.0)
Platelets: 268 10*3/uL (ref 150–400)
RBC: 5.02 MIL/uL (ref 4.22–5.81)
RDW: 14.2 % (ref 11.5–15.5)
WBC: 7.5 10*3/uL (ref 4.0–10.5)
nRBC: 0 % (ref 0.0–0.2)

## 2023-02-05 LAB — SURGICAL PCR SCREEN
MRSA, PCR: NEGATIVE
Staphylococcus aureus: NEGATIVE

## 2023-02-05 SURGERY — LUMBAR LAMINECTOMY/DECOMPRESSION MICRODISCECTOMY 1 LEVEL
Anesthesia: General | Site: Back | Laterality: Left

## 2023-02-05 MED ORDER — LIDOCAINE 2% (20 MG/ML) 5 ML SYRINGE
INTRAMUSCULAR | Status: DC | PRN
  Administered 2023-02-05: 60 mg via INTRAVENOUS

## 2023-02-05 MED ORDER — DIAZEPAM 5 MG PO TABS: 5.0000 mg | ORAL_TABLET | Freq: Four times a day (QID) | ORAL | Status: DC | PRN

## 2023-02-05 MED ORDER — MENTHOL 3 MG MT LOZG: 1.0000 | LOZENGE | OROMUCOSAL | Status: DC | PRN

## 2023-02-05 MED ORDER — ORAL CARE MOUTH RINSE: 15.0000 mL | Freq: Once | OROMUCOSAL | Status: AC

## 2023-02-05 MED ORDER — UMECLIDINIUM BROMIDE 62.5 MCG/ACT IN AEPB
1.0000 | INHALATION_SPRAY | Freq: Every day | RESPIRATORY_TRACT | Status: DC
  Administered 2023-02-06: 1 via RESPIRATORY_TRACT
  Filled 2023-02-05: qty 7

## 2023-02-05 MED ORDER — LIDOCAINE-EPINEPHRINE 0.5 %-1:200000 IJ SOLN
INTRAMUSCULAR | Status: DC | PRN
  Administered 2023-02-05: 10 mL via INTRADERMAL

## 2023-02-05 MED ORDER — SODIUM CHLORIDE 0.9 % IV SOLN: 250.0000 mL | INTRAVENOUS | Status: DC

## 2023-02-05 MED ORDER — ROCURONIUM BROMIDE 10 MG/ML (PF) SYRINGE
PREFILLED_SYRINGE | INTRAVENOUS | Status: AC
  Filled 2023-02-05: qty 10

## 2023-02-05 MED ORDER — MIDAZOLAM HCL 2 MG/2ML IJ SOLN
INTRAMUSCULAR | Status: DC | PRN
  Administered 2023-02-05: 2 mg via INTRAVENOUS

## 2023-02-05 MED ORDER — ACETAMINOPHEN 160 MG/5ML PO SOLN: 1000.0000 mg | Freq: Once | ORAL | Status: DC | PRN

## 2023-02-05 MED ORDER — OXYCODONE HCL ER 10 MG PO T12A
10.0000 mg | EXTENDED_RELEASE_TABLET | Freq: Two times a day (BID) | ORAL | Status: DC
  Administered 2023-02-05 – 2023-02-06 (×2): 10 mg via ORAL
  Filled 2023-02-05 (×2): qty 1

## 2023-02-05 MED ORDER — OXYCODONE HCL 5 MG PO TABS
5.0000 mg | ORAL_TABLET | ORAL | Status: DC | PRN
  Filled 2023-02-05: qty 1

## 2023-02-05 MED ORDER — SODIUM CHLORIDE 0.9% FLUSH
3.0000 mL | Freq: Two times a day (BID) | INTRAVENOUS | Status: DC
  Administered 2023-02-05: 3 mL via INTRAVENOUS

## 2023-02-05 MED ORDER — ONDANSETRON HCL 4 MG PO TABS: 4.0000 mg | ORAL_TABLET | Freq: Four times a day (QID) | ORAL | Status: DC | PRN

## 2023-02-05 MED ORDER — FENTANYL CITRATE (PF) 100 MCG/2ML IJ SOLN
INTRAMUSCULAR | Status: DC | PRN
  Administered 2023-02-05: 100 ug via INTRAVENOUS
  Administered 2023-02-05 (×2): 50 ug via INTRAVENOUS

## 2023-02-05 MED ORDER — DEXMEDETOMIDINE HCL IN NACL 80 MCG/20ML IV SOLN
INTRAVENOUS | Status: AC
  Filled 2023-02-05: qty 20

## 2023-02-05 MED ORDER — CHLORHEXIDINE GLUCONATE 0.12 % MT SOLN
OROMUCOSAL | Status: AC
  Administered 2023-02-05: 15 mL via OROMUCOSAL
  Filled 2023-02-05: qty 15

## 2023-02-05 MED ORDER — ACETAMINOPHEN 650 MG RE SUPP: 650.0000 mg | RECTAL | Status: DC | PRN

## 2023-02-05 MED ORDER — ALBUTEROL SULFATE (2.5 MG/3ML) 0.083% IN NEBU: 2.5000 mg | INHALATION_SOLUTION | Freq: Four times a day (QID) | RESPIRATORY_TRACT | Status: DC | PRN

## 2023-02-05 MED ORDER — ONDANSETRON HCL 4 MG/2ML IJ SOLN: 4.0000 mg | Freq: Four times a day (QID) | INTRAMUSCULAR | Status: DC | PRN

## 2023-02-05 MED ORDER — 0.9 % SODIUM CHLORIDE (POUR BTL) OPTIME
TOPICAL | Status: DC | PRN
  Administered 2023-02-05: 1000 mL

## 2023-02-05 MED ORDER — ROCURONIUM BROMIDE 10 MG/ML (PF) SYRINGE
PREFILLED_SYRINGE | INTRAVENOUS | Status: DC | PRN
  Administered 2023-02-05: 10 mg via INTRAVENOUS
  Administered 2023-02-05: 30 mg via INTRAVENOUS
  Administered 2023-02-05: 50 mg via INTRAVENOUS

## 2023-02-05 MED ORDER — THROMBIN (RECOMBINANT) 5000 UNITS EX SOLR
CUTANEOUS | Status: DC | PRN
  Administered 2023-02-05: 5 mL via TOPICAL

## 2023-02-05 MED ORDER — FENTANYL CITRATE (PF) 100 MCG/2ML IJ SOLN
INTRAMUSCULAR | Status: AC
  Filled 2023-02-05: qty 2

## 2023-02-05 MED ORDER — ZOLPIDEM TARTRATE 5 MG PO TABS: 5.0000 mg | ORAL_TABLET | Freq: Every evening | ORAL | Status: DC | PRN

## 2023-02-05 MED ORDER — ONDANSETRON HCL 4 MG/2ML IJ SOLN
INTRAMUSCULAR | Status: DC | PRN
  Administered 2023-02-05: 4 mg via INTRAVENOUS

## 2023-02-05 MED ORDER — OXYCODONE HCL 5 MG PO TABS
10.0000 mg | ORAL_TABLET | ORAL | Status: DC | PRN
  Administered 2023-02-06: 10 mg via ORAL
  Filled 2023-02-05: qty 2

## 2023-02-05 MED ORDER — DEXAMETHASONE SODIUM PHOSPHATE 10 MG/ML IJ SOLN
INTRAMUSCULAR | Status: DC | PRN
  Administered 2023-02-05: 10 mg via INTRAVENOUS

## 2023-02-05 MED ORDER — FENTANYL CITRATE (PF) 100 MCG/2ML IJ SOLN
25.0000 ug | INTRAMUSCULAR | Status: DC | PRN
  Administered 2023-02-05 (×3): 50 ug via INTRAVENOUS

## 2023-02-05 MED ORDER — PROPOFOL 10 MG/ML IV BOLUS
INTRAVENOUS | Status: DC | PRN
  Administered 2023-02-05: 200 mg via INTRAVENOUS

## 2023-02-05 MED ORDER — THROMBIN (RECOMBINANT) 5000 UNITS EX SOLR
CUTANEOUS | Status: AC
  Filled 2023-02-05: qty 5000

## 2023-02-05 MED ORDER — MORPHINE SULFATE (PF) 2 MG/ML IV SOLN: 2.0000 mg | INTRAVENOUS | Status: DC | PRN

## 2023-02-05 MED ORDER — CHLORHEXIDINE GLUCONATE CLOTH 2 % EX PADS: 6.0000 | MEDICATED_PAD | Freq: Once | CUTANEOUS | Status: DC

## 2023-02-05 MED ORDER — LIDOCAINE-EPINEPHRINE 0.5 %-1:200000 IJ SOLN
INTRAMUSCULAR | Status: AC
  Filled 2023-02-05: qty 50

## 2023-02-05 MED ORDER — PHENYLEPHRINE 80 MCG/ML (10ML) SYRINGE FOR IV PUSH (FOR BLOOD PRESSURE SUPPORT)
PREFILLED_SYRINGE | INTRAVENOUS | Status: AC
  Filled 2023-02-05: qty 10

## 2023-02-05 MED ORDER — ONDANSETRON HCL 4 MG/2ML IJ SOLN
INTRAMUSCULAR | Status: AC
  Filled 2023-02-05: qty 2

## 2023-02-05 MED ORDER — ACETAMINOPHEN 10 MG/ML IV SOLN
INTRAVENOUS | Status: AC
  Filled 2023-02-05: qty 100

## 2023-02-05 MED ORDER — MIDAZOLAM HCL 2 MG/2ML IJ SOLN
INTRAMUSCULAR | Status: AC
  Filled 2023-02-05: qty 2

## 2023-02-05 MED ORDER — BUDESON-GLYCOPYRROL-FORMOTEROL 160-9-4.8 MCG/ACT IN AERO: 2.0000 | INHALATION_SPRAY | RESPIRATORY_TRACT | Status: DC

## 2023-02-05 MED ORDER — HYDROMORPHONE HCL 1 MG/ML IJ SOLN
0.2500 mg | INTRAMUSCULAR | Status: DC | PRN
  Administered 2023-02-05 (×2): 0.5 mg via INTRAVENOUS

## 2023-02-05 MED ORDER — CEFAZOLIN SODIUM-DEXTROSE 2-4 GM/100ML-% IV SOLN
INTRAVENOUS | Status: AC
  Filled 2023-02-05: qty 100

## 2023-02-05 MED ORDER — KETOROLAC TROMETHAMINE 15 MG/ML IJ SOLN
15.0000 mg | Freq: Four times a day (QID) | INTRAMUSCULAR | Status: DC
  Administered 2023-02-05 – 2023-02-06 (×2): 15 mg via INTRAVENOUS
  Filled 2023-02-05 (×2): qty 1

## 2023-02-05 MED ORDER — SUGAMMADEX SODIUM 200 MG/2ML IV SOLN
INTRAVENOUS | Status: DC | PRN
  Administered 2023-02-05: 200 mg via INTRAVENOUS

## 2023-02-05 MED ORDER — LACTATED RINGERS IV SOLN: INTRAVENOUS | Status: DC

## 2023-02-05 MED ORDER — EPHEDRINE 5 MG/ML INJ
INTRAVENOUS | Status: AC
  Filled 2023-02-05: qty 5

## 2023-02-05 MED ORDER — CHLORHEXIDINE GLUCONATE 0.12 % MT SOLN: 15.0000 mL | Freq: Once | OROMUCOSAL | Status: AC

## 2023-02-05 MED ORDER — POTASSIUM CHLORIDE IN NACL 20-0.9 MEQ/L-% IV SOLN: INTRAVENOUS | Status: DC

## 2023-02-05 MED ORDER — ACETAMINOPHEN 10 MG/ML IV SOLN
1000.0000 mg | Freq: Once | INTRAVENOUS | Status: DC | PRN
  Administered 2023-02-05: 1000 mg via INTRAVENOUS

## 2023-02-05 MED ORDER — PROPOFOL 10 MG/ML IV BOLUS
INTRAVENOUS | Status: AC
  Filled 2023-02-05: qty 20

## 2023-02-05 MED ORDER — DEXAMETHASONE SODIUM PHOSPHATE 10 MG/ML IJ SOLN
INTRAMUSCULAR | Status: AC
  Filled 2023-02-05: qty 1

## 2023-02-05 MED ORDER — OXYCODONE HCL 5 MG/5ML PO SOLN: 5.0000 mg | Freq: Once | ORAL | Status: AC | PRN

## 2023-02-05 MED ORDER — FENTANYL CITRATE (PF) 250 MCG/5ML IJ SOLN
INTRAMUSCULAR | Status: AC
  Filled 2023-02-05: qty 5

## 2023-02-05 MED ORDER — THROMBIN 5000 UNITS EX SOLR
CUTANEOUS | Status: AC
  Filled 2023-02-05: qty 5000

## 2023-02-05 MED ORDER — OXYCODONE HCL 5 MG PO TABS
ORAL_TABLET | ORAL | Status: AC
  Filled 2023-02-05: qty 1

## 2023-02-05 MED ORDER — SODIUM CHLORIDE 0.9% FLUSH: 3.0000 mL | INTRAVENOUS | Status: DC | PRN

## 2023-02-05 MED ORDER — HYDROMORPHONE HCL 1 MG/ML IJ SOLN
INTRAMUSCULAR | Status: AC
  Filled 2023-02-05: qty 1

## 2023-02-05 MED ORDER — OXYCODONE HCL 5 MG PO TABS
5.0000 mg | ORAL_TABLET | Freq: Once | ORAL | Status: AC | PRN
  Administered 2023-02-05: 5 mg via ORAL

## 2023-02-05 MED ORDER — PHENOL 1.4 % MT LIQD: 1.0000 | OROMUCOSAL | Status: DC | PRN

## 2023-02-05 MED ORDER — CEFAZOLIN SODIUM-DEXTROSE 2-4 GM/100ML-% IV SOLN
2.0000 g | INTRAVENOUS | Status: AC
  Administered 2023-02-05: 2 g via INTRAVENOUS

## 2023-02-05 MED ORDER — ACETAMINOPHEN 325 MG PO TABS: 650.0000 mg | ORAL_TABLET | ORAL | Status: DC | PRN

## 2023-02-05 MED ORDER — ACETAMINOPHEN 500 MG PO TABS: 1000.0000 mg | ORAL_TABLET | Freq: Once | ORAL | Status: DC | PRN

## 2023-02-05 MED ORDER — HEPARIN SODIUM (PORCINE) 5000 UNIT/ML IJ SOLN: 5000.0000 [IU] | Freq: Three times a day (TID) | INTRAMUSCULAR | Status: DC

## 2023-02-05 MED ORDER — LIDOCAINE 2% (20 MG/ML) 5 ML SYRINGE
INTRAMUSCULAR | Status: AC
  Filled 2023-02-05: qty 5

## 2023-02-05 MED ORDER — MOMETASONE FURO-FORMOTEROL FUM 200-5 MCG/ACT IN AERO
2.0000 | INHALATION_SPRAY | Freq: Two times a day (BID) | RESPIRATORY_TRACT | Status: DC
  Administered 2023-02-05 – 2023-02-06 (×2): 2 via RESPIRATORY_TRACT
  Filled 2023-02-05: qty 8.8

## 2023-02-05 SURGICAL SUPPLY — 53 items
ADH SKN CLS APL DERMABOND .7 (GAUZE/BANDAGES/DRESSINGS) ×1
APL SKNCLS STERI-STRIP NONHPOA (GAUZE/BANDAGES/DRESSINGS)
BAG COUNTER SPONGE SURGICOUNT (BAG) ×1 IMPLANT
BAG SPNG CNTER NS LX DISP (BAG) ×2
BAND INSRT 18 STRL LF DISP RB (MISCELLANEOUS)
BAND RUBBER #18 3X1/16 STRL (MISCELLANEOUS) ×2 IMPLANT
BENZOIN TINCTURE PRP APPL 2/3 (GAUZE/BANDAGES/DRESSINGS) IMPLANT
BLADE CLIPPER SURG (BLADE) IMPLANT
BUR MATCHSTICK NEURO 3.0 LAGG (BURR) ×1 IMPLANT
BUR PRECISION FLUTE 5.0 (BURR) IMPLANT
CANISTER SUCT 3000ML PPV (MISCELLANEOUS) ×1 IMPLANT
DERMABOND ADVANCED .7 DNX12 (GAUZE/BANDAGES/DRESSINGS) ×1 IMPLANT
DRAPE LAPAROTOMY 100X72X124 (DRAPES) ×1 IMPLANT
DRAPE MICROSCOPE NONGLARE (MISCELLANEOUS) IMPLANT
DRAPE MICROSCOPE SLANT 54X150 (MISCELLANEOUS) ×1 IMPLANT
DRAPE SURG 17X23 STRL (DRAPES) ×1 IMPLANT
DURAPREP 26ML APPLICATOR (WOUND CARE) ×1 IMPLANT
ELECT BLADE 4.0 EZ CLEAN MEGAD (MISCELLANEOUS) ×1
ELECT REM PT RETURN 9FT ADLT (ELECTROSURGICAL) ×1
ELECTRODE BLDE 4.0 EZ CLN MEGD (MISCELLANEOUS) IMPLANT
ELECTRODE REM PT RTRN 9FT ADLT (ELECTROSURGICAL) ×1 IMPLANT
GAUZE 4X4 16PLY ~~LOC~~+RFID DBL (SPONGE) IMPLANT
GAUZE SPONGE 4X4 12PLY STRL (GAUZE/BANDAGES/DRESSINGS) IMPLANT
GLOVE BIO SURGEON STRL SZ 6.5 (GLOVE) IMPLANT
GLOVE ECLIPSE 6.5 STRL STRAW (GLOVE) ×1 IMPLANT
GLOVE EXAM NITRILE XL STR (GLOVE) IMPLANT
GOWN STRL REUS W/ TWL LRG LVL3 (GOWN DISPOSABLE) ×2 IMPLANT
GOWN STRL REUS W/ TWL XL LVL3 (GOWN DISPOSABLE) IMPLANT
GOWN STRL REUS W/TWL 2XL LVL3 (GOWN DISPOSABLE) IMPLANT
GOWN STRL REUS W/TWL LRG LVL3 (GOWN DISPOSABLE) ×3
GOWN STRL REUS W/TWL XL LVL3 (GOWN DISPOSABLE)
KIT BASIN OR (CUSTOM PROCEDURE TRAY) ×1 IMPLANT
KIT TURNOVER KIT B (KITS) ×1 IMPLANT
NDL HYPO 25X1 1.5 SAFETY (NEEDLE) ×1 IMPLANT
NDL SPNL 18GX3.5 QUINCKE PK (NEEDLE) IMPLANT
NEEDLE HYPO 25X1 1.5 SAFETY (NEEDLE) ×1 IMPLANT
NEEDLE SPNL 18GX3.5 QUINCKE PK (NEEDLE) IMPLANT
NS IRRIG 1000ML POUR BTL (IV SOLUTION) ×1 IMPLANT
PACK LAMINECTOMY NEURO (CUSTOM PROCEDURE TRAY) ×1 IMPLANT
PAD ARMBOARD 7.5X6 YLW CONV (MISCELLANEOUS) ×3 IMPLANT
SOL ELECTROSURG ANTI STICK (MISCELLANEOUS) ×1
SOLUTION ELECTROSURG ANTI STCK (MISCELLANEOUS) ×1 IMPLANT
SPIKE FLUID TRANSFER (MISCELLANEOUS) ×1 IMPLANT
SPONGE SURGIFOAM ABS GEL SZ50 (HEMOSTASIS) ×1 IMPLANT
SPONGE T-LAP 4X18 ~~LOC~~+RFID (SPONGE) IMPLANT
STRIP CLOSURE SKIN 1/2X4 (GAUZE/BANDAGES/DRESSINGS) IMPLANT
SUT VIC AB 0 CT1 18XCR BRD8 (SUTURE) ×1 IMPLANT
SUT VIC AB 0 CT1 8-18 (SUTURE) ×1
SUT VIC AB 2-0 CT1 18 (SUTURE) ×1 IMPLANT
SUT VIC AB 3-0 SH 8-18 (SUTURE) ×1 IMPLANT
TOWEL GREEN STERILE (TOWEL DISPOSABLE) ×1 IMPLANT
TOWEL GREEN STERILE FF (TOWEL DISPOSABLE) ×1 IMPLANT
WATER STERILE IRR 1000ML POUR (IV SOLUTION) ×1 IMPLANT

## 2023-02-05 NOTE — Anesthesia Postprocedure Evaluation (Signed)
Anesthesia Post Note  Patient: Curtis Greene  Procedure(s) Performed: LEFT LUMBAR FOUR - FIVE Microdiscectomy (Left: Back)     Patient location during evaluation: PACU Anesthesia Type: General Level of consciousness: awake and alert Pain management: pain level controlled Vital Signs Assessment: post-procedure vital signs reviewed and stable Respiratory status: spontaneous breathing, nonlabored ventilation, respiratory function stable and patient connected to nasal cannula oxygen Cardiovascular status: blood pressure returned to baseline and stable Postop Assessment: no apparent nausea or vomiting Anesthetic complications: no  No notable events documented.  Last Vitals:  Vitals:   02/05/23 2115 02/05/23 2139  BP: 119/86 (!) 125/92  Pulse: 73 81  Resp: 11 20  Temp: 36.8 C 36.8 C  SpO2: 94% 93%    Last Pain:  Vitals:   02/05/23 2200  TempSrc:   PainSc: 0-No pain                 Kennieth Rad

## 2023-02-05 NOTE — Op Note (Signed)
02/05/2023  8:16 PM  PATIENT:  Curtis Greene  57 y.o. 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  BLOOD ADMINISTERED:none  CELL SAVER GIVEN:not used  COUNT:per nursing  DRAINS: none   SPECIMEN:  No Specimen  DICTATION: [name] was taken to the operating room, intubated and placed under a general anesthetic without difficulty. He was positioned prone on a Wilson frame with all pressure points padded. His back was prepped and draped in a sterile manner. I opened the skin with a 10 blade and carried the dissection down to the thoracolumbar fascia. I used both sharp dissection and the monopolar cautery to expose the lamina of 4, and L5. I confirmed my location with an intraoperative xray.  I used the drill, Kerrison punches, and curettes to perform a semihemilaminectomy of L4. I used the punches to remove the ligamentum flavum to expose the thecal sac. I brought the microscope into the operative field and started the decompression of the spinal canal, thecal sac and L5, and L4 root(s). I cauterized epidural veins overlying the disc space then divided them sharply. I opened the disc space with a 15 blade and proceeded with the discectomy. I used pituitary rongeurs, curettes, and other instruments to remove disc material. After the discectomy was completed I inspected the 4 and 5 nerve roots and felt they were well decompressed. I explored rostrally, laterally, medially, and caudally and was satisfied with the decompression. I irrigated the wound, then closed in layers. I approximated the thoracolumbar fascia, subcutaneous, and subcuticular planes with  vicryl sutures. I used dermabond for a sterile dressing.   PLAN OF CARE: Admit for overnight observation  PATIENT DISPOSITION:  PACU - hemodynamically stable.   Delay start of Pharmacological VTE agent (>24hrs) due to surgical blood loss or risk of bleeding:  no

## 2023-02-05 NOTE — H&P (Signed)
BP (!) 129/90   Pulse 95   Temp 99.8 F (37.7 C) (Oral)   Resp (!) 22   Ht 6' (1.829 m)   Wt 108.9 kg   SpO2 92%   BMI 32.55 kg/m  Curtis Greene comes in today so we can go over the MRI of the lumbar spine.  What he has is lateral recess and foraminal narrowing at L4-5 on the left side.  This is unchanged from his film done approximately a year ago.  He weighs 241 pounds.  Temperature is 97, blood pressure 122/86, pulse 65.  Pain is 7/10.  He has had physical therapy.  He has had now significant time.   He has had injections, and we would like to proceed with this.  I am not going to offer an arthrodesis, he is not painful on his right side.  He does have facet arthropathy and fluid in the joints.  Just going to go on the left and do a discectomy.  He is in agreement with this.  We will go ahead and put this request in for the operation.  Risks, benefits of bleeding, infection, no relief, need for further surgery, possibility to need a fusion, and no improvement were discussed, along with other risks.  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   Past Surgical History:  Procedure Laterality Date   INCISION AND DRAINAGE ABSCESS Left 03/19/2014   Procedure: INCISION AND DRAINAGE ABSCESS;  Surgeon: Robyne Askew, MD;  Location: [facility];  Service: General;  Laterality: Left;   KNEE ARTHROSCOPY Left approx 1977   Family History  Problem Relation Age of Onset   Lung cancer Father        smoked   Social History   Socioeconomic History   Marital status: Single    Spouse name: Not on file   Number of children: Not on file   Years of education: Not on file   Highest education level: Not on file  Occupational History   Not on file  Tobacco Use   Smoking status: Never    Passive exposure: Past   Smokeless tobacco: Never  Vaping  Use   Vaping Use: Never used  Substance and Sexual Activity   Alcohol use: Yes    Alcohol/week: 29.0 standard drinks of alcohol    Types: 21 Cans of beer, 8 Shots of liquor per week    Comment: a pint of bourbon on the weekends.   Drug use: Not Currently    Types: Marijuana    Comment: stopped in 1995   Sexual activity: Not on file  Other Topics Concern   Not on file  Social History Narrative   Not on file   Social Determinants of Health   Financial Resource Strain: Not on file  Food Insecurity: Not on file  Transportation Needs: Not on file  Physical Activity: Not on file  Stress: Not on file  Social Connections: Not on file  Intimate Partner Violence: Not on file   Prior to Admission medications   Medication Sig Start Date End Date Taking? Authorizing Provider  albuterol (VENTOLIN HFA) 108 (90 Base) MCG/ACT inhaler Inhale 2 puffs into the lungs every 6 (six) hours as needed for wheezing or shortness of breath. 09/29/22  Yes Omar Person, MD  Budeson-Glycopyrrol-Formoterol (BREZTRI AEROSPHERE) 160-9-4.8 MCG/ACT  AERO Inhale 2 puffs into the lungs in the morning and at bedtime. 09/14/22  Yes Omar Person, MD

## 2023-02-05 NOTE — Transfer of Care (Signed)
Immediate Anesthesia Transfer of Care Note  Patient: Curtis Greene  Procedure(s) Performed: LEFT LUMBAR FOUR - FIVE Microdiscectomy (Left: Back)  Patient Location: PACU  Anesthesia Type:General  Level of Consciousness: awake, alert , and oriented  Airway & Oxygen Therapy: Patient Spontanous Breathing  Post-op Assessment: Report given to RN, Post -op Vital signs reviewed and stable, and Patient moving all extremities X 4  Post vital signs: Reviewed and stable  Last Vitals:  Vitals Value Taken Time  BP 145/92 02/05/23 2012  Temp    Pulse 82 02/05/23 2015  Resp 15 02/05/23 2015  SpO2 91 % 02/05/23 2015  Vitals shown include unvalidated device data.  Last Pain:  Vitals:   02/05/23 1227  TempSrc:   PainSc: 7       Patients Stated Pain Goal: 2 (02/05/23 1227)  Complications: No notable events documented.

## 2023-02-05 NOTE — Anesthesia Preprocedure Evaluation (Signed)
Anesthesia Evaluation  Patient identified by MRN, date of birth, ID band Patient awake    Reviewed: Allergy & Precautions, H&P , NPO status , Patient's Chart, lab work & pertinent test results  Airway Mallampati: II  TM Distance: >3 FB Neck ROM: Full    Dental no notable dental hx.    Pulmonary COPD   Pulmonary exam normal breath sounds clear to auscultation       Cardiovascular negative cardio ROS Normal cardiovascular exam Rhythm:Regular Rate:Normal     Neuro/Psych negative neurological ROS  negative psych ROS   GI/Hepatic negative GI ROS,,,(+)     substance abuse  alcohol use  Endo/Other  negative endocrine ROS    Renal/GU negative Renal ROS  negative genitourinary   Musculoskeletal negative musculoskeletal ROS (+)    Abdominal   Peds negative pediatric ROS (+)  Hematology negative hematology ROS (+)   Anesthesia Other Findings   Reproductive/Obstetrics negative OB ROS                             Anesthesia Physical Anesthesia Plan  ASA: 2  Anesthesia Plan: General   Post-op Pain Management: Toradol IV (intra-op)*   Induction: Intravenous  PONV Risk Score and Plan: 1 and Ondansetron, Dexamethasone and Treatment may vary due to age or medical condition  Airway Management Planned: Oral ETT  Additional Equipment:   Intra-op Plan:   Post-operative Plan: Extubation in OR  Informed Consent: I have reviewed the patients History and Physical, chart, labs and discussed the procedure including the risks, benefits and alternatives for the proposed anesthesia with the patient or authorized representative who has indicated his/her understanding and acceptance.     Dental advisory given  Plan Discussed with: CRNA and Surgeon  Anesthesia Plan Comments:        Anesthesia Quick Evaluation

## 2023-02-05 NOTE — Anesthesia Procedure Notes (Signed)
Procedure Name: Intubation Date/Time: 02/05/2023 6:12 PM  Performed by: De Nurse, CRNAPre-anesthesia Checklist: Patient identified, Emergency Drugs available, Suction available and Patient being monitored Patient Re-evaluated:Patient Re-evaluated prior to induction Oxygen Delivery Method: Circle System Utilized Preoxygenation: Pre-oxygenation with 100% oxygen Induction Type: IV induction Ventilation: Mask ventilation without difficulty Laryngoscope Size: Mac and 4 Grade View: Grade III Tube type: Oral Tube size: 7.5 mm Number of attempts: 1 Airway Equipment and Method: Stylet and Oral airway Placement Confirmation: ETT inserted through vocal cords under direct vision, positive ETCO2 and breath sounds checked- equal and bilateral Secured at: 24 cm Tube secured with: Tape Dental Injury: Teeth and Oropharynx as per pre-operative assessment

## 2023-02-06 ENCOUNTER — Encounter (HOSPITAL_COMMUNITY): Payer: Self-pay | Admitting: Neurosurgery

## 2023-02-06 DIAGNOSIS — M5126 Other intervertebral disc displacement, lumbar region: Secondary | ICD-10-CM | POA: Diagnosis not present

## 2023-02-06 LAB — CBC
HCT: 44.8 % (ref 39.0–52.0)
Hemoglobin: 14.9 g/dL (ref 13.0–17.0)
MCH: 30 pg (ref 26.0–34.0)
MCHC: 33.3 g/dL (ref 30.0–36.0)
MCV: 90.3 fL (ref 80.0–100.0)
Platelets: 282 10*3/uL (ref 150–400)
RBC: 4.96 MIL/uL (ref 4.22–5.81)
RDW: 14 % (ref 11.5–15.5)
WBC: 10.3 10*3/uL (ref 4.0–10.5)
nRBC: 0 % (ref 0.0–0.2)

## 2023-02-06 LAB — CREATININE, SERUM
Creatinine, Ser: 1.27 mg/dL — ABNORMAL HIGH (ref 0.61–1.24)
GFR, Estimated: >60 mL/min (ref >60)

## 2023-02-06 MED ORDER — OXYCODONE HCL 5 MG PO TABS: 5.0000 mg | ORAL_TABLET | ORAL | 0 refills | Status: DC | PRN

## 2023-02-06 MED ORDER — CYCLOBENZAPRINE HCL 10 MG PO TABS: 10.0000 mg | ORAL_TABLET | Freq: Three times a day (TID) | ORAL | 0 refills | Status: AC | PRN

## 2023-02-06 NOTE — Plan of Care (Signed)

## 2023-02-06 NOTE — Progress Notes (Signed)
PT Cancellation Note  Patient Details Name: MATHEO RATHBONE MRN: [id number] DOB: Apr 30, 1966   Cancelled Treatment:    Reason Eval/Treat Not Completed: PT screened, no needs identified, will sign off (pt reports independence with mobility and understanding of precautions after OT evaluation; answered questions/concerns).  Lillia Pauls, PT, DPT Acute Rehabilitation Services Office [phone]    Norval Morton 02/06/2023, 10:15 AM

## 2023-02-06 NOTE — Progress Notes (Signed)
Explained discharge instructions to patient. Reviewed follow up appointment and next medication administration times. Also reviewed education. Patient verbalized having an understanding for instructions given. All belongings are in the patient's possession. IV was removed by nightshift RN. No other needs verbalized. Transported downstairs for discharge. 

## 2023-02-06 NOTE — Evaluation (Signed)
Occupational Therapy Evaluation Patient Details Name: Curtis Greene MRN: [id number] DOB: September 29, 1966 Today's Date: 02/06/2023   History of Present Illness 57 y.o. M admitted on 02/05/23 s/p HNP of L4/5. PMH significant for COPD and back pain.   Clinical Impression   Pt admitted for procedure above. PTA he reported he was independent with all ADL's and IADL's with back and leg pain. At this time he reports surgical pain and appears to have mild leg weakness and decreased activity tolerance. Overall he is at a mod I level with intermittent supervision due to fatigue. Recommending a shower chair at home to assist with safe bathing. Pt has no further skilled OT needs and will be discharged from OT.       Recommendations for follow up therapy are one component of a multi-disciplinary discharge planning process, led by the attending physician.  Recommendations may be updated based on patient status, additional functional criteria and insurance authorization.   Assistance Recommended at Discharge PRN  Patient can return home with the following A little help with walking and/or transfers;A little help with bathing/dressing/bathroom    Functional Status Assessment  Patient has had a recent decline in their functional status and demonstrates the ability to make significant improvements in function in a reasonable and predictable amount of time.  Equipment Recommendations  Other (comment) (recommended pt get a shower chair or bench for safe showering)    Recommendations for Other Services       Precautions / Restrictions Precautions Precautions: Back Precaution Booklet Issued: Yes (comment) Precaution Comments: Reviewed all precautions and compensatory strategies. Restrictions Weight Bearing Restrictions: No      Mobility Bed Mobility Overal bed mobility: Modified Independent             General bed mobility comments: increased time and effort    Transfers Overall transfer level:  Modified independent Equipment used: None               General transfer comment: Increased time and effort      Balance Overall balance assessment: Mild deficits observed, not formally tested                                         ADL either performed or assessed with clinical judgement   ADL Overall ADL's : Modified independent                                       General ADL Comments: Pt able to complete ADL's with increased time and use of compensatory strategies     Vision Baseline Vision/History: 0 No visual deficits Ability to See in Adequate Light: 0 Adequate Patient Visual Report: No change from baseline Vision Assessment?: No apparent visual deficits     Perception Perception Perception Tested?: No   Praxis Praxis Praxis tested?: Not tested    Pertinent Vitals/Pain Pain Assessment Pain Assessment: 0-10 Pain Score: 7  Pain Location: Back Pain Descriptors / Indicators: Aching, Discomfort, Grimacing Pain Intervention(s): Monitored during session, Limited activity within patient's tolerance, Premedicated before session     Hand Dominance Right   Extremity/Trunk Assessment Upper Extremity Assessment Upper Extremity Assessment: Overall WFL for tasks assessed   Lower Extremity Assessment Lower Extremity Assessment: Generalized weakness   Cervical / Trunk Assessment Cervical / Trunk Assessment: Back Surgery  Communication Communication Communication: No difficulties   Cognition Arousal/Alertness: Awake/alert Behavior During Therapy: WFL for tasks assessed/performed Overall Cognitive Status: Within Functional Limits for tasks assessed                                       General Comments  VSS on RA, Incision appears clean and dry    Exercises     Shoulder Instructions      Home Living Family/patient expects to be discharged to:: Private residence Living Arrangements: Spouse/significant  other Available Help at Discharge: Family;Available PRN/intermittently Type of Home: House Home Access: Stairs to enter [employer] of Steps: 5 Entrance Stairs-Rails: Can reach both Home Layout: Two level Alternate Level Stairs-Number of Steps: full flight Alternate Level Stairs-Rails: Right Bathroom Shower/Tub: Producer, television/film/video: Standard     Home Equipment: None          Prior Functioning/Environment Prior Level of Function : Independent/Modified Independent                        OT Problem List: Decreased strength;Decreased activity tolerance;Pain      OT Treatment/Interventions:      OT Goals(Current goals can be found in the care plan section) Acute Rehab OT Goals Patient Stated Goal: To go home OT Goal Formulation: With patient Time For Goal Achievement: 02/06/23 Potential to Achieve Goals: Good  OT Frequency:      Co-evaluation              AM-PAC OT "6 Clicks" Daily Activity     Outcome Measure Help from another person eating meals?: None Help from another person taking care of personal grooming?: None Help from another person toileting, which includes using toliet, bedpan, or urinal?: None Help from another person bathing (including washing, rinsing, drying)?: None Help from another person to put on and taking off regular upper body clothing?: None Help from another person to put on and taking off regular lower body clothing?: None 6 Click Score: 24   End of Session Nurse Communication: Mobility status  Activity Tolerance: Patient tolerated treatment well Patient left: in bed;with call bell/phone within reach  OT Visit Diagnosis: Unsteadiness on feet (R26.81);Other abnormalities of gait and mobility (R26.89);Muscle weakness (generalized) (M62.81)                Time: 1610-9604 OT Time Calculation (min): 26 min Charges:  OT General Charges $OT Visit: 1 Visit OT Evaluation $OT Eval Low Complexity: 1 Low OT  Treatments $Self Care/Home Management : 8-22 mins  Trish Mage, OTR/L [place] [facility]  Icesis Renn Elane Bing Plume 02/06/2023, 9:44 AM

## 2023-02-08 MED FILL — Thrombin (Recombinant) For Soln 5000 Unit: CUTANEOUS | Qty: 5000 | Status: AC

## 2023-02-08 MED FILL — Thrombin For Soln 5000 Unit: CUTANEOUS | Qty: 5000 | Status: AC

## 2023-02-18 ENCOUNTER — Telehealth: Payer: Self-pay | Admitting: Student

## 2023-02-18 NOTE — Telephone Encounter (Signed)
Patient would like the nurse to call regarding his symptoms from having walking pneumonia.  He stated he is still having a cough and not feeling very well.  Please call patient to discuss further.  CB# [phone]

## 2023-02-23 MED ORDER — LEVOFLOXACIN 750 MG PO TABS
750.0000 mg | ORAL_TABLET | Freq: Every day | ORAL | 0 refills | Status: DC
Start: 2023-02-23 — End: 2023-09-03

## 2023-02-23 MED ORDER — AMOXICILLIN-POT CLAVULANATE 875-125 MG PO TABS: 1.0000 | ORAL_TABLET | Freq: Two times a day (BID) | ORAL | 0 refills | Status: DC

## 2023-02-23 MED ORDER — PREDNISONE 10 MG PO TABS: ORAL_TABLET | ORAL | 0 refills | Status: DC

## 2023-02-23 NOTE — Telephone Encounter (Signed)
Called and spoke with pt who states he was diagnosed with walking pneumonia about 3 weeks ago.  States he is still having problems after that. Pt had an xray recently which showed that there was fluid in his lungs. Pt is having complaints of a cough getting up clear phlegm and also has had some wheezing.   Tried to move pt's current appt up from 5/15 to him seeing Dr. Thora Lance tomorrow 5/8 but pt said that he has recently moved to [facility] so it is hard to just come to [facility] for appts  unless he has one that is already scheduled.   Pt is wanting to know if meds could be prescribed to see if it would help with his symptoms to hold him over until his scheduled appt with Dr. Thora Lance. Dr. Thora Lance, please advise on this for pt.  Pt is aware that Dr. Thora Lance is not in the office today 5/7 when message is being sent to him and is okay to wait until 5/8 when Dr. Thora Lance is back at the office.

## 2023-02-23 NOTE — Telephone Encounter (Signed)
Called and discussed. Still dyspneic, wheezing, and coughing up phlegm. Had augmentin/ z pack and prednisone burst a few weeks ago. Will prescribe levaquin, prednisone burst. Encouraged him to bring copy of CT Chest to next visit.

## 2023-02-24 ENCOUNTER — Encounter: Payer: Self-pay | Admitting: Student

## 2023-02-25 NOTE — Telephone Encounter (Signed)
Dr. Meier, please see mychart message from pt and advise. °

## 2023-03-01 NOTE — Progress Notes (Signed)
Synopsis: Referred for cough by Farris Has, MD  Subjective:   PATIENT ID: Curtis Greene GENDER: male DOB: [birth year], MRN: [id number]  Chief Complaint  Patient presents with   Follow-up    Cough has improved. He has had some dry mouth and hoarseness. Breathing is overall doing well. He had back surgery 02/06/23.    55yM with history of referred for cough, seen in past by Dr. 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  He has had cough - typically not productive but occasionally has clear sputum, wheezing, dyspnea over the last year. He is not sure if he's tried steroids before. Some relief with inhalers but not a ton. He says he has never tried medicine for acid reflux before. Does have some heartburn it sounds like but no dysphagia. He had some sinus congestion/pnd during the worst of pollen season but has cleared up some.   Scheduled for CT coronaries   Father died of lung cancer, was a smoker  Worked in various jobs with exposures to different chemicals. 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 Didn't wear a mask when he was working. Never smoker.   Interval HPI:  Continued on breztri.  Bout of pneumonia since last visit, AECOPD that was slow to improve - a couple rounds of ABX, steroids. Is feeling better at this point finally. Still on breztri 2 puffs twice daily.   L4-5 microdiscectomy 4/19   Otherwise pertinent review of systems is negative.  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     Family History  Problem Relation Age of Onset   Lung cancer Father        smoked     Past Surgical History:  Procedure Laterality Date   INCISION AND DRAINAGE ABSCESS Left 03/19/2014   Procedure: INCISION AND DRAINAGE ABSCESS;  Surgeon: Robyne Askew, MD;  Location: [facility]  ORS;  Service: General;  Laterality: Left;   KNEE ARTHROSCOPY Left approx 1977   LUMBAR LAMINECTOMY/DECOMPRESSION MICRODISCECTOMY Left 02/05/2023   Procedure: LEFT LUMBAR FOUR - FIVE Microdiscectomy;  Surgeon: Coletta Memos, MD;  Location: [facility] OR;  Service: Neurosurgery;  Laterality: Left;  RM 19 to follow 3C    Social History   Socioeconomic History   Marital status: Single    Spouse name: Not on file   Number of children: Not on file   Years of education: Not on file   Highest education level: Not on file  Occupational History   Not on file  Tobacco Use   Smoking status: Never    Passive exposure: Past   Smokeless tobacco: Never  Vaping Use   Vaping Use: Never used  Substance and Sexual Activity   Alcohol use: Yes    Alcohol/week: 29.0 standard drinks of alcohol    Types: 21 Cans of beer, 8 Shots of liquor per week    Comment: a pint of bourbon on the weekends.   Drug use: Not Currently    Types: Marijuana    Comment: stopped in 1995   Sexual activity: Not on file  Other Topics Concern   Not on file  Social History Narrative   Not on file   Social Determinants of Health   Financial Resource Strain: Not on file  Food Insecurity: Not on file  Transportation Needs:  Not on file  Physical Activity: Not on file  Stress: Not on file  Social Connections: Not on file  Intimate Partner Violence: Not on file     No Known Allergies   Outpatient Medications Prior to Visit  Medication Sig Dispense Refill   albuterol (VENTOLIN HFA) 108 (90 Base) MCG/ACT inhaler Inhale 2 puffs into the lungs every 6 (six) hours as needed for wheezing or shortness of breath. 8 g 11   Budeson-Glycopyrrol-Formoterol (BREZTRI AEROSPHERE) 160-9-4.8 MCG/ACT AERO Inhale 2 puffs into the lungs in the morning and at bedtime. 10.7 g 6   cyclobenzaprine (FLEXERIL) 10 MG tablet Take 1 tablet (10 mg total) by mouth 3 (three) times daily as needed for muscle spasms. 30 tablet 0   levofloxacin (LEVAQUIN) 750  MG tablet Take 1 tablet (750 mg total) by mouth daily. 7 tablet 0   oxyCODONE (OXY IR/ROXICODONE) 5 MG immediate release tablet Take 1 tablet (5 mg total) by mouth every 3 (three) hours as needed for moderate pain ((score 4 to 6)). 30 tablet 0   predniSONE (DELTASONE) 10 MG tablet Take 4 tabs by mouth for 3 days, then 3 for 3 days, 2 for 3 days, 1 for 3 days and stop 30 tablet 0   No facility-administered medications prior to visit.       Objective:   Physical Exam:  General appearance: 57 y.o., male, NAD, conversant  Eyes: anicteric sclerae; PERRL, tracking appropriately HENT: NCAT; MMM Neck: Trachea midline; no lymphadenopathy, no JVD Lungs: ctab bl, with normal respiratory effort CV: RRR, no murmur  Abdomen: Soft, non-tender; non-distended, BS present  Extremities: No peripheral edema, warm Skin: Normal turgor and texture; no rash Psych: Appropriate affect Neuro: Alert and oriented to person and place, no focal deficit     Vitals:   03/03/23 1011  BP: 110/80  Pulse: 91  Temp: 98.1 F (36.7 C)  TempSrc: Oral  SpO2: 98%  Weight: 239 lb (108.4 kg)  Height: 6' (1.829 m)      98% on RA BMI Readings from Last 3 Encounters:  03/03/23 32.41 kg/m  02/05/23 32.55 kg/m  09/29/22 33.14 kg/m   Wt Readings from Last 3 Encounters:  03/03/23 239 lb (108.4 kg)  02/05/23 240 lb (108.9 kg)  09/29/22 241 lb (109.3 kg)     CBC    Component Value Date/Time   WBC 10.3 02/05/2023 2324   RBC 4.96 02/05/2023 2324   HGB 14.9 02/05/2023 2324   HCT 44.8 02/05/2023 2324   PLT 282 02/05/2023 2324   MCV 90.3 02/05/2023 2324   MCH 30.0 02/05/2023 2324   MCHC 33.3 02/05/2023 2324   RDW 14.0 02/05/2023 2324   LYMPHSABS 1.4 09/29/2022 0924   MONOABS 0.5 09/29/2022 0924   EOSABS 0.1 09/29/2022 0924   BASOSABS 0.0 09/29/2022 0924     Chest Imaging: CXR 09/16/21 reviewed by me unremarkable  CT Coronaries 05/13/22 reviewed by me with bronchial wall thickening  CT Chest  02/03/23 reviewed by me with a few foci of scar in RML, R and left lung bases, and minor extent of TIB nodularity LLL and ggo  Pulmonary Functions Testing Results:    Latest Ref Rng & Units 06/09/2022    3:36 PM  PFT Results  FVC-Pre L 2.62   FVC-Predicted Pre % 50   FVC-Post L 2.92   FVC-Predicted Post % 55   Pre FEV1/FVC % % 59   Post FEV1/FCV % % 63   FEV1-Pre L 1.55   FEV1-Predicted  Pre % 38   FEV1-Post L 1.85   DLCO uncorrected ml/min/mmHg 25.37   DLCO UNC% % 84   DLCO corrected ml/min/mmHg 25.37   DLCO COR %Predicted % 84   DLVA Predicted % 131   TLC L 6.11   TLC % Predicted % 82   RV % Predicted % 125    Severe obstruction, BD response, air trapping    Assessment & Plan:   # DOE # COPD gold B, ACOS # Severe obstruction with BD response, air trapping # 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 Never smoker. Control was probably worse when he was living in last apartment and may be related to his mold allergy.   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 You may find it helpful to use this with a spacer which I've prescribed (can also purchase through [facility] for about $10-20). This could potentially help with hoarseness. Look up video online for ideal use with spacer. Need to rinse spacer after each use.  - albuterol 1-2 puffs every 4-6 hours as needed this is a RESCUE inhaler - Referral placed to Dr. Alcide Evener with [facility] to establish with pulmonologist closer to his new home - See you in 6 months (if unable to get appointment by then with Dr. Algis Greenhouse) or sooner if need be!       Omar Person,  MD  Pulmonary Critical Care 03/03/2023 10:40 AM

## 2023-03-03 ENCOUNTER — Ambulatory Visit (INDEPENDENT_AMBULATORY_CARE_PROVIDER_SITE_OTHER): Payer: 59 | Admitting: Student

## 2023-03-03 ENCOUNTER — Encounter: Payer: Self-pay | Admitting: Student

## 2023-03-03 VITALS — BP 110/80 | HR 91 | Temp 98.1°F | Ht 72.0 in | Wt 239.0 lb

## 2023-03-03 DIAGNOSIS — J4489 Other specified chronic obstructive pulmonary disease: Secondary | ICD-10-CM | POA: Diagnosis not present

## 2023-03-03 MED ORDER — BREZTRI AEROSPHERE 160-9-4.8 MCG/ACT IN AERO: 2.0000 | INHALATION_SPRAY | Freq: Two times a day (BID) | RESPIRATORY_TRACT | 11 refills | Status: DC

## 2023-03-03 NOTE — Patient Instructions (Addendum)
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 You may find it helpful to use this with a spacer which I've prescribed (can also purchase through [facility] for about $10-20). This could potentially help with hoarseness. Look up video online for ideal use with spacer. Need to rinse spacer after each use.  - albuterol 1-2 puffs every 4-6 hours as needed this is a RESCUE inhaler - See you in 6 months or sooner if need be!

## 2023-06-30 ENCOUNTER — Telehealth: Payer: Self-pay | Admitting: Primary Care

## 2023-06-30 NOTE — Telephone Encounter (Signed)
Patient is coming in for a copy of his office visit notes from Dr.Miere that states he has COPD. A medical release form was signed and he was given the notes. Nothing else needed.

## 2023-09-03 ENCOUNTER — Encounter: Payer: Self-pay | Admitting: Primary Care

## 2023-09-03 ENCOUNTER — Ambulatory Visit (INDEPENDENT_AMBULATORY_CARE_PROVIDER_SITE_OTHER): Payer: 59 | Admitting: Primary Care

## 2023-09-03 VITALS — BP 122/60 | HR 71 | Temp 98.1°F | Ht 72.0 in | Wt 249.0 lb

## 2023-09-03 DIAGNOSIS — J4489 Other specified chronic obstructive pulmonary disease: Secondary | ICD-10-CM

## 2023-09-03 NOTE — Patient Instructions (Addendum)
Recommendations: Continue Breztri Aerosphere two puffs morning and evening (rinse mouth after use) Use Albuterol prior to activity that can cause shortness of breath/wheezing (if needed to use more than 3-4 times a day let provider know) Consider adding Montelukast (Singulair) to regimen is asthma symptoms persist or worsen Continue to work on weight loss  Continue exercise aim to get 3-5 times week   Follow-up: 6 months with BETH NP     Montelukast Chewable Tablets What is this medication? MONTELUKAST (mon te LOO kast) prevents and treats the symptoms of asthma and allergies. It works by decreasing inflammation in the airways, making it easier to breathe. Do not use this medication to treat a sudden asthma attack. This medicine may be used for other purposes; ask your health care provider or pharmacist if you have questions. COMMON BRAND NAME(S): Singulair What should I tell my care team before I take this medication? They need to know if you have any of these conditions: Liver disease Phenylketonuria An unusual or allergic reaction to montelukast, other medications, foods, dyes, or preservatives Pregnant or trying to get pregnant Breastfeeding How should I use this medication? Take this medication by mouth with water. Take it as directed on the prescription label at the same time every day. Chew or crush it completely before swallowing. Do not swallow tablets whole. You can take this medication with or without food. If it upsets your stomach, take it with food. Keep taking it unless your care team tells you to stop. A special MedGuide will be given to you by the pharmacist with each prescription and refill. Be sure to read this information carefully each time. Talk to your care team about the use of this medication in children. While it may be prescribed for children as young as 2 years for selected conditions, precautions do apply. Overdosage: If you think you have taken too much  of this medicine contact a poison control center or emergency room at once. NOTE: This medicine is only for you. Do not share this medicine with others. What if I miss a dose? If you miss a dose, skip it. Take your next dose at the normal time. Do not take extra or 2 doses at the same time to make up for the missed dose. What may interact with this medication? Medications for seizures, such as phenytoin, phenobarbital, carbamazepine Rifabutin Rifampin This list may not describe all possible interactions. Give your health care provider a list of all the medicines, herbs, non-prescription drugs, or dietary supplements you use. Also tell them if you smoke, drink alcohol, or use illegal drugs. Some items may interact with your medicine. What should I watch for while using this medication? Visit your care team for regular checks on your progress. Tell your care team if your allergy or asthma symptoms do not improve. Take your medication even when you do not have symptoms. If you have asthma, talk to your care team about what to do in an acute asthma attack. Always have your inhaled rescue medication for asthma attacks with you. This medication may cause thoughts of suicide or depression. This includes sudden changes in mood, behaviors, or thoughts. Also watch for sudden changes in feelings and behaviors, such as feeling anxious, agitated, panicky, irritable, hostile, aggressive, impulsive, severely restless, overly excited and hyperactive, or not being able to sleep. Call your care team right away if you experience these behaviors or worsening depression. What side effects may I notice from receiving this medication? Side effects that  you should report to your care team as soon as possible: Allergic reactions--skin rash, itching, hives, swelling of the face, lips, tongue, or throat Flu-like symptoms--fever, chills, muscle pain, cough, headache, fatigue Mood and behavior changes such as anxiety,  nervousness, confusion, hallucinations, irritability, hostility, thoughts of suicide or self-harm, worsening mood, feelings of depression Pain, tingling, or numbness in the hands or feet Sinus pain or pressure around the face or forehead Trouble sleeping Vivid dreams or nightmares Side effects that usually do not require medical attention (report to your care team if they continue or are bothersome): Cough Diarrhea Headache Runny or stuffy nose Sore throat Stomach pain This list may not describe all possible side effects. Call your doctor for medical advice about side effects. You may report side effects to FDA at 1-800-FDA-1088. Where should I keep my medication? Keep out of the reach of children and pets. Store between 15 and 30 degrees C (59 and 86 degrees F). Protect from light and moisture. Keep the container tightly closed. Get rid of any unused medication after the expiration date. To get rid of medications that are no longer needed or expired: Take the medication to a medication take-back program. Check with your pharmacy or law enforcement to find a location. If you cannot return the medication, check the label or package insert to see if the medication should be thrown out in the garbage or flushed down the toilet. If you are not sure, ask your care team. If it is safe to put in the trash, empty the medication out of the container. Mix the medication with cat litter, dirt, coffee grounds, or other unwanted substance. Seal the mixture in a bag or container. Put it in the trash. NOTE: This sheet is a summary. It may not cover all possible information. If you have questions about this medicine, talk to your doctor, pharmacist, or health care provider.  2024 Elsevier/Gold Standard (2022-05-04 00:00:00)   Asthma, Adult  Asthma is a condition that causes swelling and narrowing of the airways. These are the passages that lead from the nose and mouth down into the lungs. When asthma  symptoms get worse it is called an asthma attack or flare. This can make it hard to breathe. Asthma flares can range from minor to life-threatening. There is no cure for asthma, but medicines and lifestyle changes can help to control it. What are the causes? It is not known exactly what causes asthma, but certain things can cause asthma symptoms to get worse (triggers). What can trigger an asthma attack? Cigarette smoke. Mold. Dust. Your pet's skin flakes (dander). Cockroaches. Pollen. Air pollution (like household cleaners, wood smoke, smog, or Therapist, occupational). What are the signs or symptoms? Trouble breathing (shortness of breath). Coughing. Making high-pitched whistling sounds when you breathe, most often when you breathe out (wheezing). Chest tightness. Tiredness with little activity. Poor exercise tolerance. How is this treated? Controller medicines that help prevent asthma symptoms. Fast-acting reliever or rescue medicines. These give short-term relief of asthma symptoms. Allergy medicines if your attacks are brought on by allergens. Medicines to help control the body's defense (immune) system. Staying away from the things that cause asthma attacks. Follow these instructions at home: Avoiding triggers in your home Do not allow anyone to smoke in your home. Limit use of fireplaces and wood stoves. Get rid of pests (such as roaches and mice) and their droppings. Keep your home clean. Clean your floors. Dust regularly. Use cleaning products that do not smell. Wash bed  sheets and blankets every week in hot water. Dry them in a dryer. Have someone vacuum when you are not home. Change your heating and air conditioning filters often. Use blankets that are made of polyester or cotton. General instructions Take over-the-counter and prescription medicines only as told by your doctor. Do not smoke or use any products that contain nicotine or tobacco. If you need help quitting, ask  your doctor. Stay away from secondhand smoke. Avoid doing things outdoors when allergen counts are high and when air quality is low. Warm up before you exercise. Take time to cool down after exercise. Use a peak flow meter as told by your doctor. A peak flow meter is a tool that measures how well your lungs are working. Keep track of the peak flow meter's readings. Write them down. Follow your asthma action plan. This is a written plan for taking care of your asthma and treating your attacks. Make sure you get all the shots (vaccines) that your doctor recommends. Ask your doctor about a flu shot and a pneumonia shot. Keep all follow-up visits. Contact a doctor if: You have wheezing, shortness of breath, or a cough even while taking medicine to prevent attacks. The mucus you cough up (sputum) is thicker than usual. The mucus you cough up changes from clear or white to yellow, green, gray, or is bloody. You have problems from the medicine you are taking, such as: A rash. Itching. Swelling. Trouble breathing. You need reliever medicines more than 2-3 times a week. Your peak flow reading is still at 50-79% of your personal best after following the action plan for 1 hour. You have a fever. Get help right away if: You seem to be worse and are not responding to medicine during an asthma attack. You are short of breath even at rest. You get short of breath when doing very little activity. You have trouble eating, drinking, or talking. You have chest pain or tightness. You have a fast heartbeat. Your lips or fingernails start to turn blue. You are light-headed or dizzy, or you faint. Your peak flow is less than 50% of your personal best. You feel too tired to breathe normally. These symptoms may be an emergency. Get help right away. Call 911. Do not wait to see if the symptoms will go away. Do not drive yourself to the hospital. Summary Asthma is a long-term (chronic) condition in which  the airways get tight and narrow. An asthma attack can make it hard to breathe. Asthma cannot be cured, but medicines and lifestyle changes can help control it. Make sure you understand how to avoid triggers and how and when to use your medicines. Avoid things that can cause allergy symptoms (allergens). These include animal skin flakes (dander) and pollen from trees or grass. Avoid things that pollute the air. These may include household cleaners, wood smoke, smog, or chemical odors. This information is not intended to replace advice given to you by your health care provider. Make sure you discuss any questions you have with your health care provider. Document Revised: 07/14/2021 Document Reviewed: 07/14/2021 Elsevier Patient Education  2024 [employer].

## 2023-09-03 NOTE — Progress Notes (Unsigned)
@Patient  ID: Curtis Greene, male    DOB: Sep 04, 1966, 57 y.o.   MRN: [id number]  No chief complaint on file.   Referring provider: Farris Has, MD   Discussed the use of AI scribe software for clinical note transcription with the patient, who gave verbal consent to proceed.  09/03/2023 57 year old, never smoked. Patient presents today for follow-up.  Patient of Dr. 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  Patient was last seen in May 2024. He is maintained on Breztri Aerosphere 2 puffs twice daily and as needed albuterol  Pulmonary function testing has shown severe obstruction with positive bronchodilator response and evidence of air trapping.  Asthma main contributor to patient's symptoms which could be related to secondhand/work exposures.  Control felt to be worse when patient was living in apartment may be related to mold allergy.   History of Present Illness   The patient, diagnosed with asthma/COPD, has been on Breztri and albuterol as needed since he last consultation six months ago. He reports no significant change in his condition since the last visit. The patient has been trying to increase his physical activity following a back surgery in April, which led to a weight gain of approximately 15-20 pounds. He notes that while walking on flat surfaces or downhill does not pose a problem, he experiences difficulty when walking uphill. The patient does not use albuterol regularly but has started using Breztri before walking.  The patient denies any allergy symptoms such as runny nose, sneezing, congestion, or sore throat. He reports occasional coughing and mucus production, which he believes might be facilitated by the use of Breztri. The patient does not notice wheezing on a daily basis, but others around him have pointed it out.  The patient's daily activities, such as grocery shopping or going to the mall, are not significantly affected by his condition.  However, he has to pace himself when climbing stairs due to shortness of breath. Despite these symptoms, the patient feels that he functions fairly well on a daily basis. He has not experienced any episodes of pneumonia since starting on Breztri, and he believes the medication has helped his symptoms.         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 8 g 11   Budeson-Glycopyrrol-Formoterol (BREZTRI AEROSPHERE) 160-9-4.8 MCG/ACT AERO Inhale 2 puffs into the lungs in the morning and at bedtime. 10.7 g 11   cyclobenzaprine (FLEXERIL) 10 MG tablet Take 1 tablet (10 mg total) by mouth 3 (three) times daily as needed for muscle spasms. 30 tablet 0   levofloxacin (LEVAQUIN) 750 MG tablet Take 1 tablet (750 mg total) by mouth daily. 7 tablet 0   No facility-administered medications prior to visit.    Review of Systems  Review of Systems  Constitutional: Negative.  Respiratory: Negative.     Physical Exam  There were no vitals taken for this visit. Physical Exam Constitutional:      General: He is not in acute distress.    Appearance: Normal appearance. He is not ill-appearing.  Cardiovascular:     Rate and Rhythm: Normal rate and regular rhythm.  Pulmonary:     Effort: Pulmonary effort is normal.     Breath sounds: Normal  breath sounds. No wheezing or rhonchi.  Skin:    General: Skin is warm and dry.  Neurological:     General: No focal deficit present.     Mental Status: He is alert and oriented to person, place, and time. Mental status is at baseline.  Psychiatric:        Mood and Affect: Mood normal.        Behavior: Behavior normal.        Thought Content: Thought content normal.        Judgment: Judgment normal.      Lab Results:  CBC    Component Value Date/Time   WBC 10.3 02/05/2023 2324   RBC 4.96 02/05/2023 2324   HGB 14.9 02/05/2023 2324   HCT 44.8 02/05/2023 2324   PLT 282 02/05/2023 2324   MCV 90.3 02/05/2023 2324   MCH 30.0 02/05/2023 2324   MCHC 33.3 02/05/2023 2324   RDW 14.0 02/05/2023 2324   LYMPHSABS 1.4 09/29/2022 0924   MONOABS 0.5 09/29/2022 0924   EOSABS 0.1 09/29/2022 0924   BASOSABS 0.0 09/29/2022 0924    BMET    Component Value Date/Time   NA 136 02/05/2023 1220   NA 142 03/31/2022 1440   K 3.8 02/05/2023 1220   CL 103 02/05/2023 1220   CO2 23 02/05/2023 1220   GLUCOSE 91 02/05/2023 1220   BUN 12 02/05/2023 1220   BUN 10 03/31/2022 1440   CREATININE 1.27 (H) 02/05/2023 2324   CALCIUM 9.2 02/05/2023 1220   GFRNONAA >60 02/05/2023 2324   GFRAA >90 03/19/2014 1646    BNP No results found for: "BNP"  ProBNP No results found for: "PROBNP"  Imaging: No results found.   Assessment & Plan:   1. Asthma-COPD overlap syndrome (HCC)  Asthma/COPD Stable symptoms with exertional dyspnea on incline. No wheezing noted on exam. Currently on Breztri and Albuterol as needed. Discussed the use of Albuterol prior to exercise and the potential addition of Singulair for mold allergy and asthma control. -Continue Breztri and Albuterol as needed. -Consider using Albuterol prior to exercise. -Consider adding Singulair if symptoms persist or worsen.  Weight Gain Gained 15-20 pounds since back surgery in April, contributing to exertional dyspnea. -Encourage weight  loss and increased physical activity as tolerated.  General Health Maintenance -Declined flu shot. -Follow up in 6 months or sooner if symptoms worsen or if patient decides to start Singulair.       Glenford Bayley, NP 09/03/2023

## 2023-09-24 ENCOUNTER — Ambulatory Visit (HOSPITAL_COMMUNITY): Admission: RE | Admit: 2023-09-24 | Payer: 59 | Source: Ambulatory Visit

## 2023-09-24 ENCOUNTER — Ambulatory Visit (HOSPITAL_COMMUNITY): Payer: 59

## 2023-09-24 ENCOUNTER — Other Ambulatory Visit (HOSPITAL_COMMUNITY): Payer: Self-pay | Admitting: Family Medicine

## 2023-09-24 DIAGNOSIS — R52 Pain, unspecified: Secondary | ICD-10-CM

## 2023-09-28 ENCOUNTER — Encounter (HOSPITAL_COMMUNITY): Payer: 59

## 2023-09-29 ENCOUNTER — Ambulatory Visit (HOSPITAL_COMMUNITY)
Admission: RE | Admit: 2023-09-29 | Discharge: 2023-09-29 | Disposition: A | Discharge status: Home / Self Care | Payer: 59 | Source: Ambulatory Visit | Attending: Family Medicine | Admitting: Family Medicine

## 2023-09-29 DIAGNOSIS — R52 Pain, unspecified: Secondary | ICD-10-CM | POA: Insufficient documentation

## 2024-04-27 ENCOUNTER — Other Ambulatory Visit: Payer: Self-pay | Admitting: Primary Care

## 2024-04-27 MED ORDER — BREZTRI AEROSPHERE 160-9-4.8 MCG/ACT IN AERO: 2.0000 | INHALATION_SPRAY | Freq: Two times a day (BID) | RESPIRATORY_TRACT | 11 refills | Status: AC

## 2024-04-27 NOTE — Telephone Encounter (Signed)
Re-filed

## 2024-04-27 NOTE — Telephone Encounter (Signed)
 Copied from CRM [id number]. Topic: Clinical - Medication Refill >> Apr 27, 2024 12:52 PM Rozanna G wrote: Medication: Budeson-Glycopyrrol-Formoterol  (BREZTRI  AEROSPHERE) 160-9-4.8 MCG/ACT AERO  Has the patient contacted their pharmacy? Yes (Agent: If no, request that the patient contact the pharmacy for the refill. If patient does not wish to contact the pharmacy document the reason why and proceed with request.) (Agent: If yes, when and what did the pharmacy advise?)  This is the patient's preferred pharmacy:    [id number] - [place], [place] - [address] AT [address] OF [facility] [address] [place] [address] Phone: [phone] Fax: [phone]  Is this the correct pharmacy for this prescription? Yes If no, delete pharmacy and type the correct one.   Has the prescription been filled recently? Yes  Is the patient out of the medication? Yes  Has the patient been seen for an appointment in the last year OR does the patient have an upcoming appointment? Yes  Can we respond through MyChart? Yes  Agent: Please be advised that Rx refills may take up to 3 business days. We ask that you follow-up with your pharmacy.

## 2024-05-09 ENCOUNTER — Telehealth: Payer: Self-pay | Admitting: *Deleted

## 2024-05-09 NOTE — Telephone Encounter (Signed)
 Patient came by office to sign release form to obtain office notes for personal reasons. Notes printed.

## 2024-06-22 ENCOUNTER — Ambulatory Visit: Admitting: Primary Care

## 2024-10-16 ENCOUNTER — Telehealth: Payer: Self-pay

## 2024-10-16 NOTE — Telephone Encounter (Signed)
" °  Spoke with patient VBU, what would be coved under new insurance    Copied from CRM [id number]. Topic: Clinical - Prescription Issue >> Oct 16, 2024 11:51 AM Ismael A wrote: Reason for CRM: pt's insurance has changed to [employer], changed as of 07/2024 - stated he went to pick up Breztri  refill last week and was told they would charge him $305 for it - he wants to know if there is another medication he could be prescribed that would be covered under his insurance or if there is a pre auth that may need to be submitted for Breztri  "

## 2024-10-17 ENCOUNTER — Other Ambulatory Visit (HOSPITAL_COMMUNITY): Payer: Self-pay

## 2024-10-17 ENCOUNTER — Telehealth: Payer: Self-pay

## 2024-10-17 NOTE — Telephone Encounter (Signed)
 Per test claims, patient has a deductible to meet causing these higher prices. After deductible is met the cost should be $47.00. Patient may want to wait to fill until the new year just in case his deductible re-sets Jan 1.  *sent to office in original request

## 2024-10-17 NOTE — Telephone Encounter (Signed)
 Thank you, relay message to patient

## 2024-11-07 ENCOUNTER — Other Ambulatory Visit (HOSPITAL_COMMUNITY): Payer: Self-pay

## 2024-11-07 ENCOUNTER — Other Ambulatory Visit: Payer: Self-pay | Admitting: Primary Care

## 2024-11-07 ENCOUNTER — Telehealth: Payer: Self-pay

## 2024-11-07 NOTE — Telephone Encounter (Signed)
 Per pharmacy, pt is requesting more affordable alternative. Routing to prior authorization team to advise.

## 2024-11-07 NOTE — Telephone Encounter (Signed)
*  Pulm  Breztri - $484.38 Trelegy Ellipta- [phone]  Patient has a deductible to meet before prices go down.

## 2024-11-09 NOTE — Telephone Encounter (Signed)
 Called and spoke to pt. Pt has not been seen in over one year, so appt was made for 11/17/24 with Landry Ferrari.       After relaying message from PA team (see below) pt verbalized understanding that the cost is due to his deductible not being met. However, patient asked if there were any other inhalers other than the Breztri  or Trelegy that would be more cost effective currently, as pt states he can not afford an almost $500 inhaler.:  Breztri - $484.38 Trelegy Ellipta- [phone]   Patient has a deductible to meet before prices go down.

## 2024-11-17 ENCOUNTER — Other Ambulatory Visit (HOSPITAL_COMMUNITY): Payer: Self-pay

## 2024-11-17 ENCOUNTER — Telehealth: Payer: Self-pay | Admitting: Primary Care

## 2024-11-17 ENCOUNTER — Telehealth: Admitting: Primary Care

## 2024-11-17 DIAGNOSIS — J4489 Other specified chronic obstructive pulmonary disease: Secondary | ICD-10-CM | POA: Insufficient documentation

## 2024-11-17 MED ORDER — ALBUTEROL SULFATE HFA 108 (90 BASE) MCG/ACT IN AERS: 2.0000 | INHALATION_SPRAY | Freq: Four times a day (QID) | RESPIRATORY_TRACT | 3 refills | Status: AC | PRN

## 2024-11-17 NOTE — Telephone Encounter (Signed)
 Please let patient know high cost Trelegy and Breztri  is coming from deductible that has not been met. Alternatives are still pricey, best option is generic Advair 250-50mcg one puff twice daily. Send in script if agreeing

## 2024-11-17 NOTE — Patient Instructions (Signed)
" °  VISIT SUMMARY: Curtis Greene, a 59 year old male with asthma-COPD overlap, came in for a follow-up visit. He has been experiencing shortness of breath and occasional wheezing, particularly since stopping Breztri  due to cost issues. He uses albuterol  as a rescue inhaler twice a day and has not had any recent asthma flare-ups requiring antibiotics or prednisone . His asthma is somewhat controlled but has interfered with his activities at home.  YOUR PLAN: -ASTHMA-COPD OVERLAP SYNDROME: Asthma-COPD overlap syndrome is a condition where a person has symptoms of both asthma and chronic obstructive pulmonary disease (COPD). You have been experiencing shortness of breath and wheezing a couple of times a day. We provided you with Breztri  samples for two weeks, with enough for a month. We also sent a message to the pharmacy team to investigate coverage for Trelegy or alternative inhalers. If Trelegy is not covered, we will consider alternatives such as Advair and Spiriva or Symbicort and Spiriva. Additionally, we sent a prescription for albuterol  to the pharmacy. Please pick up the samples by 2 PM today or next week, depending on the weather conditions.  INSTRUCTIONS: Please pick up the Breztri  samples by 2 PM today or next week, depending on the weather conditions. We will follow up with you regarding the benefits investigation for Trelegy or alternative inhalers.    Contains text generated by [employer].   "

## 2024-11-17 NOTE — Telephone Encounter (Signed)
 Needs recall placed for 1 year follow-up with Landry NP re: asthma/COPD

## 2024-11-17 NOTE — Progress Notes (Signed)
 Virtual Visit via Video Note  I connected with Curtis Greene on 11/17/24 at  1:00 PM EST by a video enabled telemedicine application and verified that I am speaking with the correct person using two identifiers.  Location: Patient: Home Provider: Office    I discussed the limitations of evaluation and management by telemedicine and the availability of in person appointments. The patient expressed understanding and agreed to proceed.  History of Present Illness: 59 year old male, never smoked. PMH significant for Asthma/COPD overlap  Previous [facility] pulmonary encounter: Discussed the use of AI scribe software for clinical note transcription with the patient, who gave verbal consent to proceed.  09/03/2023 59 year old, never smoked. Patient presents today for follow-up.  Patient of Dr. 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  Patient was last seen in May 2024. He is maintained on Breztri  Aerosphere 2 puffs twice daily and as needed albuterol   Pulmonary function testing has shown severe obstruction with positive bronchodilator response and evidence of air trapping.  Asthma main contributor to patient's symptoms which could be related to secondhand/work exposures.  Control felt to be worse when patient was living in apartment may be related to mold allergy .   History of Present Illness   The patient, diagnosed with asthma/COPD, has been on Breztri  and albuterol  as needed since he last consultation six months ago. He reports no significant change in his condition since the last visit. The patient has been trying to increase his physical activity following a back surgery in April, which led to a weight gain of approximately 15-20 pounds. He notes that while walking on flat surfaces or downhill does not pose a problem, he experiences difficulty when walking uphill.   The patient denies any allergy  symptoms such as runny nose, sneezing, congestion, or sore throat. He  reports occasional coughing and mucus production, which he believes might be facilitated by the use of Breztri . The patient does not notice wheezing on a daily basis, but others around him have pointed it out.  The patient's daily activities, such as grocery shopping or going to the mall, are not significantly affected by his condition. However, he has to pace himself when climbing stairs due to shortness of breath. Despite these symptoms, the patient feels that he functions fairly well on a daily basis.     11/17/2024- Interim hx  Discussed the use of AI scribe software for clinical note transcription with the patient, who gave verbal consent to proceed.  History of Present Illness Curtis Greene is a 59 year old male with asthma-COPD overlap who presents for follow-up.  He has a history of asthma-COPD overlap with obstructive lung disease and reversibility demonstrated on prior pulmonary function tests. He has been using Breztri  and albuterol  for management. However, he has been off Breztri  since October or November due to cost issues with Medicare coverage.  He experiences shortness of breath a couple of times a day and occasional wheezing, particularly since he stopped using Breztri . He uses albuterol  twice a day as a rescue inhaler. No recent asthma flare-ups requiring antibiotics or prednisone  in the last three months. His asthma has not woken him up at night or early in the morning in the past four weeks.  In terms of asthma control, he describes his condition as 'somewhat controlled' and notes that his asthma has interfered with his activities at home some of the time. He is retired, so work-related limitations are not applicable.  His  current medications include albuterol  as needed and pantoprazole  for heartburn, which he takes as needed. He also started taking vitamin D in December after being found to have low levels during his annual visit.  He is retired and has not been getting out as  much as he normally would.  Observations/Objective:  Appears well without overt respiratory symptoms   Assessment and Plan:  Assessment and Plan Assessment & Plan Asthma-COPD overlap syndrome Severe obstructive lung disease with asthma. Symptoms include shortness of breath and wheezing, occurring a couple of times a day. Asthma control is somewhat controlled. Currently out of Breztri  since October or November due to cost issues with Medicare coverage. No recent flare-ups requiring antibiotics or prednisone . No nighttime awakenings due to asthma. Uses albuterol  twice a day. ACT score 14. Patient has a high deductible that he has not met making Breztri  and Trelegy unafforable - Provided Breztri  samples for two-three weeks - Sent a message to the pharmacy team for a benefits investigation to determine coverage for alternative  ICS/LABA + LAMA inhalers - Sent prescription for albuterol  to the pharmacy.  Recording duration: 10 minutes  Follow Up Instructions:  FU in 1 year or sooner if needed    I discussed the assessment and treatment plan with the patient. The patient was provided an opportunity to ask questions and all were answered. The patient agreed with the plan and demonstrated an understanding of the instructions.   The patient was advised to call back or seek an in-person evaluation if the symptoms worsen or if the condition fails to improve as anticipated.  I provided 22 minutes of non-face-to-face time during this encounter.   Almarie LELON Ferrari, NP

## 2024-11-17 NOTE — Telephone Encounter (Signed)
 Can we do a benefits investigation for an alterative to Breztri ? Either Trelegy 200mcg or combo ICS/LABA and LAMA

## 2024-11-17 NOTE — Telephone Encounter (Signed)
 Can you provide alterative ICS/LABA and LAMA that is formulary on his plan?

## 2024-11-20 NOTE — Telephone Encounter (Signed)
 Left pt message to advise if okay to send Advair to pharmacy

## 2024-11-21 NOTE — Telephone Encounter (Signed)
 Recall placed
